# Patient Record
Sex: Male | Born: 1955 | Marital: Married | Smoking: Current some day smoker
Health system: Southern US, Community
[De-identification: ages and names within clinical notes are randomized; demographics above are authoritative.]

## PROBLEM LIST (undated history)

## (undated) DIAGNOSIS — B1910 Unspecified viral hepatitis B without hepatic coma: Secondary | ICD-10-CM

## (undated) DIAGNOSIS — D696 Thrombocytopenia, unspecified: Secondary | ICD-10-CM

## (undated) DIAGNOSIS — F17200 Nicotine dependence, unspecified, uncomplicated: Secondary | ICD-10-CM

---

## 2014-03-01 ENCOUNTER — Emergency Department (HOSPITAL_COMMUNITY)
Admission: EM | Admit: 2014-03-01 | Discharge: 2014-03-01 | Disposition: A | Discharge status: Home / Self Care | Payer: Self-pay | Attending: Emergency Medicine | Admitting: Emergency Medicine

## 2014-03-01 ENCOUNTER — Encounter (HOSPITAL_COMMUNITY): Payer: Self-pay | Admitting: *Deleted

## 2014-03-01 DIAGNOSIS — R509 Fever, unspecified: Secondary | ICD-10-CM | POA: Insufficient documentation

## 2014-03-01 DIAGNOSIS — B181 Chronic viral hepatitis B without delta-agent: Secondary | ICD-10-CM | POA: Insufficient documentation

## 2014-03-01 DIAGNOSIS — M545 Low back pain: Secondary | ICD-10-CM | POA: Insufficient documentation

## 2014-03-01 DIAGNOSIS — R197 Diarrhea, unspecified: Secondary | ICD-10-CM | POA: Insufficient documentation

## 2014-03-01 DIAGNOSIS — Z720 Tobacco use: Secondary | ICD-10-CM | POA: Insufficient documentation

## 2014-03-01 HISTORY — DX: Unspecified viral hepatitis B without hepatic coma: B19.10

## 2014-03-01 LAB — URINALYSIS, ROUTINE W REFLEX MICROSCOPIC
BILIRUBIN URINE: NEGATIVE
Glucose, UA: NEGATIVE mg/dL
Hgb urine dipstick: NEGATIVE
Ketones, ur: NEGATIVE mg/dL
Leukocytes, UA: NEGATIVE
Nitrite: NEGATIVE
PROTEIN: NEGATIVE mg/dL
Specific Gravity, Urine: <1.005 — ABNORMAL LOW (ref 1.005–1.030)
UROBILINOGEN UA: 1 mg/dL (ref 0.0–1.0)
pH: 7 (ref 5.0–8.0)

## 2014-03-01 LAB — COMPREHENSIVE METABOLIC PANEL
ALBUMIN: 3.5 g/dL (ref 3.5–5.2)
ALK PHOS: 56 U/L (ref 39–117)
ALT: 74 U/L — ABNORMAL HIGH (ref 0–53)
ANION GAP: 9 (ref 5–15)
AST: 45 U/L — ABNORMAL HIGH (ref 0–37)
BUN: 11 mg/dL (ref 6–23)
CO2: 26 mEq/L (ref 19–32)
CREATININE: 0.83 mg/dL (ref 0.50–1.35)
Calcium: 9.2 mg/dL (ref 8.4–10.5)
Chloride: 96 mEq/L (ref 96–112)
GFR calc Af Amer: >90 mL/min (ref >90)
GFR calc non Af Amer: >90 mL/min (ref >90)
Glucose, Bld: 103 mg/dL — ABNORMAL HIGH (ref 70–99)
POTASSIUM: 4.1 meq/L (ref 3.7–5.3)
Sodium: 131 mEq/L — ABNORMAL LOW (ref 137–147)
TOTAL PROTEIN: 7.3 g/dL (ref 6.0–8.3)
Total Bilirubin: 1.1 mg/dL (ref 0.3–1.2)

## 2014-03-01 LAB — CBC WITH DIFFERENTIAL/PLATELET
Basophils Absolute: 0 10*3/uL (ref 0.0–0.1)
Basophils Relative: 0 % (ref 0–1)
EOS ABS: 0.1 10*3/uL (ref 0.0–0.7)
Eosinophils Relative: 1 % (ref 0–5)
HCT: 40.3 % (ref 39.0–52.0)
HEMOGLOBIN: 14.4 g/dL (ref 13.0–17.0)
LYMPHS ABS: 1.4 10*3/uL (ref 0.7–4.0)
Lymphocytes Relative: 22 % (ref 12–46)
MCH: 30.4 pg (ref 26.0–34.0)
MCHC: 35.7 g/dL (ref 30.0–36.0)
MCV: 85.2 fL (ref 78.0–100.0)
MONOS PCT: 7 % (ref 3–12)
Monocytes Absolute: 0.5 10*3/uL (ref 0.1–1.0)
Neutro Abs: 4.4 10*3/uL (ref 1.7–7.7)
Neutrophils Relative %: 70 % (ref 43–77)
Platelets: 93 10*3/uL — ABNORMAL LOW (ref 150–400)
RBC: 4.73 MIL/uL (ref 4.22–5.81)
RDW: 13 % (ref 11.5–15.5)
WBC: 6.3 10*3/uL (ref 4.0–10.5)

## 2014-03-01 LAB — LIPASE, BLOOD: LIPASE: 28 U/L (ref 11–59)

## 2014-03-01 MED ORDER — ONDANSETRON HCL 4 MG PO TABS: 4.0000 mg | ORAL_TABLET | ORAL | Status: DC | PRN

## 2014-03-01 MED ORDER — LOPERAMIDE HCL 2 MG PO TABS: 2.0000 mg | ORAL_TABLET | Freq: Four times a day (QID) | ORAL | Status: DC | PRN

## 2014-03-01 NOTE — ED Notes (Signed)
Diarrhea, fever and lower back pain for x 3 days.

## 2014-03-01 NOTE — Discharge Instructions (Signed)
Diarrhea °Diarrhea is frequent loose and watery bowel movements. It can cause you to feel weak and dehydrated. Dehydration can cause you to become tired and thirsty, have a dry mouth, and have decreased urination that often is dark yellow. Diarrhea is a sign of another problem, most often an infection that will not last long. In most cases, diarrhea typically lasts 2-3 days. However, it can last longer if it is a sign of something more serious. It is important to treat your diarrhea as directed by your caregiver to lessen or prevent future episodes of diarrhea. °CAUSES  °Some common causes include: °· Gastrointestinal infections caused by viruses, bacteria, or parasites. °· Food poisoning or food allergies. °· Certain medicines, such as antibiotics, chemotherapy, and laxatives. °· Artificial sweeteners and fructose. °· Digestive disorders. °HOME CARE INSTRUCTIONS °· Ensure adequate fluid intake (hydration): Have 1 cup (8 oz) of fluid for each diarrhea episode. Avoid fluids that contain simple sugars or sports drinks, fruit juices, whole milk products, and sodas. Your urine should be clear or pale yellow if you are drinking enough fluids. Hydrate with an oral rehydration solution that you can purchase at pharmacies, retail stores, and online. You can prepare an oral rehydration solution at home by mixing the following ingredients together: °¨  - tsp table salt. °¨ ¾ tsp baking soda. °¨  tsp salt substitute containing potassium chloride. °¨ 1  tablespoons sugar. °¨ 1 L (34 oz) of water. °· Certain foods and beverages may increase the speed at which food moves through the gastrointestinal (GI) tract. These foods and beverages should be avoided and include: °¨ Caffeinated and alcoholic beverages. °¨ High-fiber foods, such as raw fruits and vegetables, nuts, seeds, and whole grain breads and cereals. °¨ Foods and beverages sweetened with sugar alcohols, such as xylitol, sorbitol, and mannitol. °· Some foods may be well  tolerated and may help thicken stool including: °¨ Starchy foods, such as rice, toast, pasta, low-sugar cereal, oatmeal, grits, baked potatoes, crackers, and bagels. °¨ Bananas. °¨ Applesauce. °· Add probiotic-rich foods to help increase healthy bacteria in the GI tract, such as yogurt and fermented milk products. °· Wash your hands well after each diarrhea episode. °· Only take over-the-counter or prescription medicines as directed by your caregiver. °· Take a warm bath to relieve any burning or pain from frequent diarrhea episodes. °SEEK IMMEDIATE MEDICAL CARE IF:  °· You are unable to keep fluids down. °· You have persistent vomiting. °· You have blood in your stool, or your stools are black and tarry. °· You do not urinate in 6-8 hours, or there is only a small amount of very dark urine. °· You have abdominal pain that increases or localizes. °· You have weakness, dizziness, confusion, or light-headedness. °· You have a severe headache. °· Your diarrhea gets worse or does not get better. °· You have a fever or persistent symptoms for more than 2-3 days. °· You have a fever and your symptoms suddenly get worse. °MAKE SURE YOU:  °· Understand these instructions. °· Will watch your condition. °· Will get help right away if you are not doing well or get worse. °Document Released: 03/22/2002 Document Revised: 08/16/2013 Document Reviewed: 12/08/2011 °ExitCare® Patient Information ©2015 [employer]. This information is not intended to replace advice given to you by your health care provider. Make sure you discuss any questions you have with your health care provider. ° ° °Emergency Department Resource Guide °1) Find a Doctor and Pay Out of Pocket °Although you   won't have to find out who is covered by your insurance plan, it is a good idea to ask around and get recommendations. You will then need to call the office and see if the doctor you have chosen will accept you as a new patient and what types of options they  offer for patients who are self-pay. Some doctors offer discounts or will set up payment plans for their patients who do not have insurance, but you will need to ask so you aren't surprised when you get to your appointment. ° °2) Contact Your Local Health Department °Not all health departments have doctors that can see patients for sick visits, but many do, so it is worth a call to see if yours does. If you don't know where your local health department is, you can check in your phone book. The CDC also has a tool to help you locate your state's health department, and many state websites also have listings of all of their local health departments. ° °3) Find a Walk-in Clinic °If your illness is not likely to be very severe or complicated, you may want to try a walk in clinic. These are popping up all over the country in pharmacies, drugstores, and shopping centers. They're usually staffed by nurse practitioners or physician assistants that have been trained to treat common illnesses and complaints. They're usually fairly quick and inexpensive. However, if you have serious medical issues or chronic medical problems, these are probably not your best option. ° °No Primary Care Doctor: °- Call [facility] at  832-8000 - they can help you locate a primary care doctor that  accepts your insurance, provides certain services, etc. °- Physician Referral Service- 1-800-533-3463 ° °Chronic Pain Problems: °Organization         Address  Phone   Notes  °[facility]  (336) 297-2271 Patients need to be referred by their primary care doctor.  ° °Medication Assistance: °Organization         Address  Phone   Notes  °[place] [facility] [address] °[place], [place] [address] (336) 641-8030 --Must be a resident of [place] °-- Must have NO insurance coverage whatsoever (no Medicaid/ Medicare, etc.) °-- The pt. MUST have a primary care doctor that directs their care  regularly and follows them in the community °  °[facility]  (866) 331-1348   °[employer]  (888) 892-1162   ° °Agencies that provide inexpensive medical care: °Organization         Address  Phone   Notes  °[facility]  (336) 832-8035   °[facility]    (336) 832-7272   °[facility] [address] °[place], [place] [address] (336) 832-4777   °[facility] [address], °[place] (336) 271-4999   °[facility]    (336) 373-0678   °[facility]    (336) 272-1050   °[facility] ° [address], [place] Phone:  (336) 832-4444, Fax:  (336) 832-4440 Hours of Operation:  9 am - 6 pm, M-F.  Also accepts Medicaid/Medicare and self-pay.  °[facility] ° [address],  Phone: (336) 832-3150, Fax: (336) 832-3151. Hours of Operation:  8:30 am - 5:30 pm, M-F.  Also accepts Medicaid and self-pay.  °[facility] [place] [address], [place] Phone: (336) 878-6027   °[facility] [address], [place], [place] (336)723-1848, Ext. 123 Mondays & Thursdays: 7-9 AM.  First 15 patients are seen on a first come, first serve basis. °  ° °Medicaid-accepting [place] Providers: ° °Organization         Address  Phone   Notes  °[facility] [address], [place] (336) 641-2100 Also accepts self-pay patients.  °[facility] [address], [place] ° (336) 856-9996   °[facility] [address], [place] (336) 288-8857   °[facility] [address] [place] [address], [place] (336) 299-7000   °Veita Bland [address] [place]  ° (336) 373-1557 Only accepts [place] Access Medicaid patients after they have their name applied to their card.  ° °Self-Pay (no insurance) in [place]: ° °Organization         Address  Phone    Notes  °Sickle Cell Patients, [facility] [address], [place] (336) 832-1970   °[facility] [address], [place] (336) 832-4400   °[facility] ° [address], [place] (336) 992-4800   °[facility]/Dr. Osei-Bonsu ° [address] [place] [address], [place] or [address], [place] (336) 841-8500 Phone number for both [place] and [place] locations is the same.  °[facility] [address], [place] (336) 299-0000   °[facility] [address] [place] [address], [place] or [address] (336) 852-7530 °(336) 878-2260   °[facility] [address], [place] (336) 350-1642, phone; (336) 294-5005, fax Sees patients 1st and 3rd Saturday of every month.  Must not qualify for public or private insurance (i.e. Medicaid, Medicare, [place] Health Choice, Veterans' Benefits) • Household income should be no more than 200% of the poverty level •The clinic cannot treat you if you are pregnant or think you are pregnant • Sexually transmitted diseases are not treated at the clinic.  ° ° °Dental Care: °Organization         Address  Phone  Notes  °[facility] [address], [place] (336) 641-6152 Accepts children up to age 21 who are enrolled in Medicaid or [place] Health Choice; pregnant women with a Medicaid card; and children who have applied for Medicaid or [place] Health Choice, but were declined, whose parents can pay a reduced fee at time of service.  °[facility] [place]  [address], [place] (336) 641-7733 Accepts children up to age 21 who are enrolled in Medicaid or [place] Health Choice; pregnant women with a Medicaid card; and children who have applied for Medicaid or  Health Choice, but were declined, whose parents can pay a reduced fee at time of service.  °[facility] ° [address],  (336) 641-4533 Patients are seen by appointment only. Walk-ins are not accepted. [facility] will see patients 18 years of age and older. °Monday - Tuesday (8am-5pm) °Most Wednesdays (8:30-5pm) °$30 per visit, cash only  °[facility] ° [address], [place] (336) 641-4533 Patients are seen by appointment only. Walk-ins are not accepted. [facility] will see patients 18 years of age and older. °One Wednesday Evening (Monthly: Volunteer Based).  $30 per visit, cash only  °[facility]  (919) 537-3737 for adults; Children under age 4, call Graduate Pediatric Dentistry at ([phone]) [phone]. Children aged 4-14, please call (919) [phone] to request a pediatric application. ° Dental services are provided in all areas of dental care including fillings, crowns and bridges, complete and partial dentures, implants, gum treatment, root canals, and extractions. Preventive care is also provided. Treatment is provided to both adults and children. °Patients are selected via a lottery and there is often a waiting list. °  °[facility] [address], °[place] ° (336) 763-8833 [email or web address] °  °[facility] [address], [place], [place] (336)723-1848, Ext. 123 Second and Fourth Thursday of each month, opens at 6:30 AM; Clinic ends at 9 AM.  Patients are seen on a first-come first-served basis, and a limited number are seen during each clinic.  ° °[facility] ° [address], [place], [place] (336) 723-7904   Eligibility Requirements °You must have lived in [place], [place], or [place] counties for at least the last three months. °  You cannot be eligible for state or federal sponsored healthcare insurance, including Veterans Administration, Medicaid, or Medicare. °  You generally cannot be eligible for healthcare insurance through your employer.  °  How to  apply: °Eligibility screenings are held every Tuesday and Wednesday afternoon from 1:00 pm until 4:00 pm. You do not need an appointment for the interview!  °[facility] [address], [place], [place] 336-631-2330   °[facility]  336-342-8273   °[facility]  336-703-3100   °[facility]  336-570-6415   ° °Behavioral Health Resources in the Community: °Intensive Outpatient Programs °Organization         Address  Phone  Notes  °[facility] [address], [place], [place] 336-878-6098   °[facility] [address], [place], [place] 336-832-9800   °[facility] [address], [place], [place] ° 336-882-2125   °[facility] [address],  °[place], [place] 1-800-853-5163 or 336-641-4981   °Substance Abuse Resources °Organization         Address  Phone  Notes  °[facility]  336-882-2125   °[facility]  336-784-9470   °[facility]  336-285-9073   °[facility]  336-845-3988   °Residential & Outpatient Substance Abuse Program  1-800-659-3381   °Psychological Services °Organization         Address  Phone  Notes  °[facility]  336- 832-9600   °[facility]  336- 378-7881   °[facility] [address], [place] 1-800-853-5163 or 336-641-4981   ° °Mobile Crisis Teams °Organization         Address  Phone  Notes  °[facility], Mobile Crisis Care Unit  1-877-626-1772   °Assertive °[facility] ° [address] [place], [place] 336-834-9664   °Sharon DeEsch [address] °[place] 336-554-5454   ° °Self-Help/Support Groups °Organization         Address  Phone             Notes  °[facility] - variety of support groups  336- 373-1402 Call for more information  °Narcotics Anonymous (NA), [facility] [address], °[place]   2 meetings at this location  ° °Residential Treatment Programs °Organization           Address  Phone  Notes  °[facility] [address],    °[place]  1-866-801-8205   °[facility] ° [address], [place], [place] 704-293-8524   °[facility] [address], [place] 336-845-3988 Admissions: 8am-3pm M-F  °[facility] [address]    °[place], [place] 336-841-1104   °[facility] [address], [place], [place] 336-379-7146   °[facility] [address]  °[place], [place] 336-285-9073   °[facility] Intensive Outpatient [address], [place], [place] 336-852-3033   °[facility] [address]  °[place], [place] 1-877-615-2722 or 336-784-9470   °[facility] [address] [place], [place] 336-227-7417 Accepts Medicaid  °[facility] [address]  °[place] 1-800-659-3381 Substance Abuse/Addiction Treatment  ° °[place] Behavioral Health Resources °Organization         Address  Phone  Notes  °[facility]  (888) 581-9988   °Julie Brannon, PhD [address] [place], [place]   (336) 349-5553 or (336) 951-0000   °[facility]   [address] °[place], [place] (336) 349-4454   °[facility] [address], [place], [place] (336) 342-8316 Insurance/Medicaid/sponsorship through [facility]  °[facility] [address]                                    [place], [place] (336) 342-8316 Therapy/tele-psych/case  °[facility] [address]  ° [place], [place] (336) 349-2233    °Dr. Arfeen  (336) 349-4544   °[facility] [place]  [employer] [place] [facility] 1) [address], [place] °2) [address], [place] °3)  [address], [place] (336) 349-3220 °(336) 342-7768 ° °(336) 342-8140   °[place] [facility]  (336) 342-1394 or (336) 342-3537 (After Hours)    ° °  °

## 2014-03-01 NOTE — ED Provider Notes (Signed)
CSN: [id number]     Arrival date & time 03/01/14  1301 History   First MD Initiated Contact with Patient 03/01/14 1832     Chief Complaint  Patient presents with  . Diarrhea  . Fever  . Back Pain     (Consider location/radiation/quality/duration/timing/severity/associated sxs/prior Treatment) HPI The patient has known medical history of hepatitis B. Otherwise he is reportedly healthy. 3 days ago the patient started having a diarrheal illness with associated chills and subjective fever. He is having about 3-4 episodes of diarrhea per day. It is not bloody. Patient gets generalized cramping abdominal pain. He did develop vomiting last night. He reports approximately 3-4 episodes of vomiting with no blood in it.  He reports also some achiness in his lower back. The patient has lived in this area for 2 years. He denies he's had any travel history. He denies any sick contacts. 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 He has not been on any antibiotics and does not take any regular medications.  The patient has a family member present who is acting as the interpreter.the patient is interacting appropriately with the family member and appears alert and appropriate. He follows commands as given. Past Medical History  Diagnosis Date  . Hepatitis B    History reviewed. No pertinent past surgical history. History reviewed. No pertinent family history. History  Substance Use Topics  . Smoking status: Current Every Day Smoker  . Smokeless tobacco: Not on file  . Alcohol Use: No    Review of Systems 10 Systems reviewed and are negative for acute change except as noted in the HPI.    Allergies  Review of patient's allergies indicates no known allergies.  Home Medications   Prior to Admission medications   Medication Sig Start Date End Date Taking? Authorizing Provider  loperamide (IMODIUM A-D) 2 MG tablet Take 1 tablet (2 mg total) by mouth 4 (four) times daily as needed for  diarrhea or loose stools. 03/01/14   Arby BarretteMarcy Royalty Fakhouri, MD  ondansetron (ZOFRAN) 4 MG tablet Take 1 tablet (4 mg total) by mouth every 4 (four) hours as needed for nausea or vomiting. 03/01/14   Arby BarretteMarcy Aaren Atallah, MD   BP 133/77 mmHg  Pulse 58  Temp(Src) 98.5 F (36.9 C) (Oral)  Resp 20  Ht 5\' 4"  (1.626 m)  Wt 135 lb 9 oz (61.491 kg)  BMI 23.26 kg/m2  SpO2 100% Physical Exam  Constitutional: He is oriented to person, place, and time. He appears well-developed and well-nourished.  HENT:  Head: Normocephalic and atraumatic.  Eyes: EOM are normal. Pupils are equal, round, and reactive to light.  Neck: Neck supple.  Cardiovascular: Normal rate, regular rhythm, normal heart sounds and intact distal pulses.   Pulmonary/Chest: Effort normal and breath sounds normal.  Abdominal: Soft. He exhibits no distension. There is tenderness (The patient endorses mild tenderness to palpation diffusely.the abdomen however is completely soft without guarding.).  Bowel sounds are hyperactive.  Musculoskeletal: Normal range of motion. He exhibits no edema.  Neurological: He is alert and oriented to person, place, and time. He has normal strength. Coordination normal. GCS eye subscore is 4. GCS verbal subscore is 5. GCS motor subscore is 6.  Skin: Skin is warm, dry and intact.  Psychiatric: He has a normal mood and affect.    ED Course  Procedures (including critical care time) Labs Review Labs Reviewed  CBC WITH DIFFERENTIAL - Abnormal; Notable for the following:    Platelets 93 (*)  All other components within normal limits  COMPREHENSIVE METABOLIC PANEL - Abnormal; Notable for the following:    Sodium 131 (*)    Glucose, Bld 103 (*)    AST 45 (*)    ALT 74 (*)    All other components within normal limits  URINALYSIS, ROUTINE W REFLEX MICROSCOPIC - Abnormal; Notable for the following:    Specific Gravity, Urine <1.005 (*)    All other components within normal limits  LIPASE, BLOOD    Imaging  Review No results found.   EKG Interpretation None      MDM   Final diagnoses:  Acute diarrhea  Chronic hepatitis B   At this point in time the patient presents with a diarrheal illness. He has also had some associated vomiting. He is nontoxic in appearance. He has known hepatitis B with some mild LFT elevations. I do not believe he is having any acute hepatitis related symptoms. By history he does not have risk factors of travel or antibiotic use or healthcare exposure.. At this time it appears most likely to be a viral gastroenteritis. The patient will be treated symptomatically.    Arby BarretteMarcy Ezra Marquess, MD 03/01/14 [phone]

## 2014-03-21 ENCOUNTER — Emergency Department (HOSPITAL_COMMUNITY): Payer: MEDICAID

## 2014-03-21 ENCOUNTER — Encounter (HOSPITAL_COMMUNITY): Payer: Self-pay | Admitting: Emergency Medicine

## 2014-03-21 ENCOUNTER — Inpatient Hospital Stay (HOSPITAL_COMMUNITY)
Admission: EM | Admit: 2014-03-21 | Discharge: 2014-03-28 | DRG: 326 | Disposition: A | Discharge status: Home / Self Care | Payer: MEDICAID | Attending: General Surgery | Admitting: General Surgery

## 2014-03-21 DIAGNOSIS — K659 Peritonitis, unspecified: Secondary | ICD-10-CM | POA: Diagnosis present

## 2014-03-21 DIAGNOSIS — K631 Perforation of intestine (nontraumatic): Secondary | ICD-10-CM

## 2014-03-21 DIAGNOSIS — K265 Chronic or unspecified duodenal ulcer with perforation: Principal | ICD-10-CM | POA: Diagnosis present

## 2014-03-21 DIAGNOSIS — F17210 Nicotine dependence, cigarettes, uncomplicated: Secondary | ICD-10-CM | POA: Diagnosis present

## 2014-03-21 DIAGNOSIS — B1910 Unspecified viral hepatitis B without hepatic coma: Secondary | ICD-10-CM | POA: Diagnosis present

## 2014-03-21 DIAGNOSIS — D696 Thrombocytopenia, unspecified: Secondary | ICD-10-CM | POA: Diagnosis present

## 2014-03-21 DIAGNOSIS — F17200 Nicotine dependence, unspecified, uncomplicated: Secondary | ICD-10-CM | POA: Diagnosis present

## 2014-03-21 HISTORY — DX: Thrombocytopenia, unspecified: D69.6

## 2014-03-21 HISTORY — DX: Nicotine dependence, unspecified, uncomplicated: F17.200

## 2014-03-21 LAB — COMPREHENSIVE METABOLIC PANEL
ALT: 137 U/L — ABNORMAL HIGH (ref 0–53)
ANION GAP: 12 (ref 5–15)
AST: 80 U/L — ABNORMAL HIGH (ref 0–37)
Albumin: 3.7 g/dL (ref 3.5–5.2)
Alkaline Phosphatase: 64 U/L (ref 39–117)
BUN: 10 mg/dL (ref 6–23)
CALCIUM: 9.1 mg/dL (ref 8.4–10.5)
CO2: 24 meq/L (ref 19–32)
CREATININE: 0.8 mg/dL (ref 0.50–1.35)
Chloride: 98 mEq/L (ref 96–112)
GFR calc Af Amer: >90 mL/min (ref >90)
Glucose, Bld: 126 mg/dL — ABNORMAL HIGH (ref 70–99)
Potassium: 3.4 mEq/L — ABNORMAL LOW (ref 3.7–5.3)
Sodium: 134 mEq/L — ABNORMAL LOW (ref 137–147)
Total Bilirubin: 0.9 mg/dL (ref 0.3–1.2)
Total Protein: 7.4 g/dL (ref 6.0–8.3)

## 2014-03-21 LAB — CBC WITH DIFFERENTIAL/PLATELET
Basophils Absolute: 0 10*3/uL (ref 0.0–0.1)
Basophils Relative: 0 % (ref 0–1)
EOS PCT: 1 % (ref 0–5)
Eosinophils Absolute: 0 10*3/uL (ref 0.0–0.7)
HCT: 41.8 % (ref 39.0–52.0)
Hemoglobin: 14.6 g/dL (ref 13.0–17.0)
LYMPHS PCT: 13 % (ref 12–46)
Lymphs Abs: 0.4 10*3/uL — ABNORMAL LOW (ref 0.7–4.0)
MCH: 31 pg (ref 26.0–34.0)
MCHC: 34.9 g/dL (ref 30.0–36.0)
MCV: 88.7 fL (ref 78.0–100.0)
MONO ABS: 0.2 10*3/uL (ref 0.1–1.0)
Monocytes Relative: 6 % (ref 3–12)
NEUTROS PCT: 80 % — AB (ref 43–77)
Neutro Abs: 2.8 10*3/uL (ref 1.7–7.7)
PLATELETS: 84 10*3/uL — AB (ref 150–400)
RBC: 4.71 MIL/uL (ref 4.22–5.81)
RDW: 13.9 % (ref 11.5–15.5)
WBC: 3.4 10*3/uL — AB (ref 4.0–10.5)

## 2014-03-21 MED ORDER — PIPERACILLIN-TAZOBACTAM 3.375 G IVPB
3.3750 g | Freq: Once | INTRAVENOUS | Status: AC
  Administered 2014-03-21: 3.375 g via INTRAVENOUS
  Filled 2014-03-21: qty 50

## 2014-03-21 MED ORDER — OXYCODONE-ACETAMINOPHEN 5-325 MG PO TABS
2.0000 | ORAL_TABLET | Freq: Once | ORAL | Status: AC
  Administered 2014-03-21: 2 via ORAL
  Filled 2014-03-21: qty 2

## 2014-03-21 MED ORDER — IOHEXOL 300 MG/ML  SOLN
50.0000 mL | Freq: Once | INTRAMUSCULAR | Status: AC | PRN
  Administered 2014-03-21: 50 mL via ORAL

## 2014-03-21 MED ORDER — SODIUM CHLORIDE 0.9 % IV BOLUS (SEPSIS)
1000.0000 mL | Freq: Once | INTRAVENOUS | Status: AC
  Administered 2014-03-21: 1000 mL via INTRAVENOUS

## 2014-03-21 MED ORDER — HYDROMORPHONE HCL 1 MG/ML IJ SOLN: 0.5000 mg | Freq: Once | INTRAMUSCULAR | Status: DC

## 2014-03-21 MED ORDER — IOHEXOL 300 MG/ML  SOLN
100.0000 mL | Freq: Once | INTRAMUSCULAR | Status: AC | PRN
  Administered 2014-03-21: 100 mL via INTRAVENOUS

## 2014-03-21 NOTE — ED Notes (Addendum)
Pt states he started having lower abdominal/pelvic pain approx. 2 hours ago after eating dinner. Alert and oriented. Speaks karen.

## 2014-03-21 NOTE — ED Notes (Signed)
Bed: ZO10WA05 Expected date:  Expected time:  Means of arrival:  Comments: EMS 58 yo male from home/lower abdominal and groin pain after dinner

## 2014-03-21 NOTE — ED Provider Notes (Signed)
CSN: [id number]     Arrival date & time 03/21/14  2022 History   First MD Initiated Contact with Patient 03/21/14 2023     Chief Complaint  Patient presents with  . Groin Pain   Yichen Moder is a 58 y.o. male with a history of hepatitis B who presents to the ED complaining of sudden onset bilateral groin pain for the past 2 hours. Patient reports his pain as 5 out of 10 and worse with walking. Patient reports his pain was sudden in onset and started about 2 hours ago. He denies testicular pain.  The patient denies dysuria, hematuria, penile pain, penile discharge, rashes, lesions, abdominal pain, nausea, vomiting, diarrhea, hematochezia, or constipation. He denies any unusual lumps or bumps.   (Consider location/radiation/quality/duration/timing/severity/associated sxs/prior Treatment) Patient is a 58 y.o. male presenting with groin pain. The history is provided by the patient. The history is limited by a language barrier. A language interpreter was used.  Groin Pain Associated symptoms include abdominal pain. Pertinent negatives include no chest pain, chills, coughing, fever, headaches, nausea, neck pain, rash, vomiting or weakness.    Past Medical History  Diagnosis Date  . Hepatitis B    History reviewed. No pertinent past surgical history. History reviewed. No pertinent family history. History  Substance Use Topics  . Smoking status: Current Every Day Smoker  . Smokeless tobacco: Not on file  . Alcohol Use: No    Review of Systems  Constitutional: Negative for fever and chills.  HENT: Negative for ear pain and trouble swallowing.   Eyes: Negative for pain.  Respiratory: Negative for cough and shortness of breath.   Cardiovascular: Negative for chest pain and palpitations.  Gastrointestinal: Positive for abdominal pain. Negative for nausea, vomiting, diarrhea, constipation, blood in stool and abdominal distention.  Genitourinary: Negative for dysuria, urgency, frequency,  hematuria, penile swelling, scrotal swelling, penile pain and testicular pain.  Musculoskeletal: Negative for neck pain.  Skin: Negative for rash and wound.  Neurological: Negative for weakness and headaches.  All other systems reviewed and are negative.     Allergies  Review of patient's allergies indicates no known allergies.  Home Medications   Prior to Admission medications   Medication Sig Start Date End Date Taking? Authorizing Provider  loperamide (IMODIUM A-D) 2 MG tablet Take 1 tablet (2 mg total) by mouth 4 (four) times daily as needed for diarrhea or loose stools. Patient not taking: Reported on 03/21/2014 03/01/14   Arby Barrette, MD  ondansetron (ZOFRAN) 4 MG tablet Take 1 tablet (4 mg total) by mouth every 4 (four) hours as needed for nausea or vomiting. Patient not taking: Reported on 03/21/2014 03/01/14   Arby Barrette, MD   BP 145/86 mmHg  Pulse 72  Temp(Src) 98.1 F (36.7 C) (Oral)  Resp 26  SpO2 100% Physical Exam  Constitutional: He appears well-developed and well-nourished. No distress.  HENT:  Head: Normocephalic and atraumatic.  Mouth/Throat: Oropharynx is clear and moist. No oropharyngeal exudate.  Eyes: Pupils are equal, round, and reactive to light. Right eye exhibits no discharge. Left eye exhibits no discharge.  Neck: Neck supple.  Cardiovascular: Normal rate, regular rhythm, normal heart sounds and intact distal pulses.  Exam reveals no gallop and no friction rub.   No murmur heard. Pulmonary/Chest: Effort normal. No respiratory distress. He has no wheezes. He has no rales.  Abdominal: Soft. Bowel sounds are normal. He exhibits no distension and no mass. There is tenderness. There is no rebound and no guarding. Hernia  confirmed negative in the right inguinal area and confirmed negative in the left inguinal area.  Patient's abdomen is soft and bowel sounds are present. Patient has bilateral lower abdominal pain to palpation. No rebound tenderness.   Genitourinary: Testes normal and penis normal. Cremasteric reflex is present. Right testis shows no mass, no swelling and no tenderness. Left testis shows no mass, no swelling and no tenderness. No penile erythema or penile tenderness. No discharge found.  Patient's GU exam is unremarkable. No penile tenderness or discharge. No testicular tenderness. No abrasions or lesions noted. No hernias noted. Cremasteric reflexes intact.  Musculoskeletal: He exhibits no edema.  Lymphadenopathy:    He has no cervical adenopathy.  Neurological: He is alert. Coordination normal.  Skin: Skin is warm and dry. No rash noted. He is not diaphoretic. No erythema. No pallor.  Psychiatric: He has a normal mood and affect. His behavior is normal.  Nursing note and vitals reviewed.   ED Course  Procedures (including critical care time) Labs Review Labs Reviewed  COMPREHENSIVE METABOLIC PANEL - Abnormal; Notable for the following:    Sodium 134 (*)    Potassium 3.4 (*)    Glucose, Bld 126 (*)    AST 80 (*)    ALT 137 (*)    All other components within normal limits  CBC WITH DIFFERENTIAL - Abnormal; Notable for the following:    WBC 3.4 (*)    Platelets 84 (*)    Neutrophils Relative % 80 (*)    Lymphs Abs 0.4 (*)    All other components within normal limits  URINALYSIS, ROUTINE W REFLEX MICROSCOPIC  I-STAT CG4 LACTIC ACID, ED  TYPE AND SCREEN  ABO/RH    Imaging Review Ct Abdomen Pelvis W Contrast  03/21/2014   CLINICAL DATA:  RIGHT lower abdominal and pelvic pain 2 hours after eating dinner this evening. History of hepatitis-B.  EXAM: CT ABDOMEN AND PELVIS WITH CONTRAST  TECHNIQUE: Multidetector CT imaging of the abdomen and pelvis was performed using the standard protocol following bolus administration of intravenous contrast.  CONTRAST:  100mL OMNIPAQUE IOHEXOL 300 MG/ML SOLN, 50mL OMNIPAQUE IOHEXOL 300 MG/ML SOLN  COMPARISON:  None.  FINDINGS: LUNG BASES: Dependent atelectasis without pleural  effusion or focal consolidation.  SOLID ORGANS: The liver, spleen, pancreas and adrenal glands are unremarkable. 21 mm gallstone without CT findings of acute cholecystitis.  GASTROINTESTINAL TRACT: The proximal duodenum are approaching extending through the apparent serosa, axial 28/93. Stomach is decompressed. Small amount of small bowel feces. Enteric contrast has not yet reached the distal small bowel. Large bowel is nonsuspicious.  KIDNEYS/ URINARY TRACT: Kidneys are orthotopic, demonstrating symmetric enhancement. No nephrolithiasis, hydronephrosis or solid renal masses. 13 mm cyst LEFT upper pole kidney. The unopacified ureters are normal in course and caliber. Delayed imaging through the kidneys demonstrates symmetric prompt contrast excretion within the proximal urinary collecting system. Urinary bladder is well distended and unremarkable.  PERITONEUM/RETROPERITONEUM: Small to moderate amount of ascites. Moderate pneumoperitoneum within the upper quadrant, predominately in the RIGHT upper quadrant, contiguous with the duodenum bowel patching. Aortoiliac vessels are normal in course and caliber, mild calcific atherosclerosis. No lymphadenopathy by CT size criteria. Internal reproductive organs are unremarkable.  SOFT TISSUE/OSSEOUS STRUCTURES: Nonsuspicious. Subcentimeter RIGHT acetabular bone island. 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  IMPRESSION: Moderate pneumoperitoneum and mild to moderate amount of ascites, this appears to reflect sequelae of perforated duodenal ulcer.  Cholelithiasis without CT findings of acute cholecystitis.  Acute findings discussed with and  reconfirmed by PA Everlene FarrierWILLIAM Yehya Brendle on 03/21/2014 at 11:13 pm.   Electronically Signed   By: Awilda Metroourtnay  Bloomer   On: 03/21/2014 23:14     EKG Interpretation None      Filed Vitals:   03/21/14 2022 03/21/14 2302  BP: 140/81 145/86  Pulse: 68 72  Temp: 98.1 F (36.7 C)   TempSrc: Oral   Resp: 16 26   SpO2: 100% 100%     MDM   Meds given in ED:  Medications  piperacillin-tazobactam (ZOSYN) IVPB 3.375 g (3.375 g Intravenous New Bag/Given 03/21/14 2342)  HYDROmorphone (DILAUDID) injection 0.5 mg ( Intravenous MAR Hold 03/22/14 0101)  0.9 % irrigation (POUR BTL) (4,000 mLs Irrigation Given 03/22/14 0158)  oxyCODONE-acetaminophen (PERCOCET/ROXICET) 5-325 MG per tablet 2 tablet (2 tablets Oral Given 03/21/14 2140)  iohexol (OMNIPAQUE) 300 MG/ML solution 50 mL (50 mLs Oral Contrast Given 03/21/14 2249)  iohexol (OMNIPAQUE) 300 MG/ML solution 100 mL (100 mLs Intravenous Contrast Given 03/21/14 2249)  sodium chloride 0.9 % bolus 1,000 mL (1,000 mLs Intravenous New Bag/Given 03/21/14 2340)    Current Discharge Medication List      Final diagnoses:  Bowel perforation   Lynelle Doctorhaing Sami is a 58 y.o. male with a history of hepatitis B who presents to the ED complaining of sudden onset bilateral groin pain for the past 2 hours. Patient's lower abdomen is tender to palpation. On patient's CT scan patient had bowel perforation, radiology suspects possible perforated duodenal ulcer. Dr. Patria Maneampos consulted general surgery. Patient is provided Dilaudid and Zosyn prior to admission.  [facility]   Patient was discussed with and evaluated by Dr. Patria Maneampos who agrees with assessment and plan.     Lawana ChambersWilliam Duncan Tanay Misuraca, PA-C 03/22/14 [id number]  Lyanne CoKevin M Campos, MD 03/22/14 [phone]

## 2014-03-21 NOTE — ED Notes (Signed)
Pt placed on cardiac monitor 

## 2014-03-21 NOTE — ED Notes (Signed)
Phone interpreter used to assess pt

## 2014-03-22 ENCOUNTER — Encounter (HOSPITAL_COMMUNITY): Payer: Self-pay | Admitting: Certified Registered"

## 2014-03-22 ENCOUNTER — Emergency Department (HOSPITAL_COMMUNITY): Payer: MEDICAID | Admitting: Anesthesiology

## 2014-03-22 ENCOUNTER — Encounter (HOSPITAL_COMMUNITY): Admission: EM | Disposition: A | Discharge status: Home / Self Care | Payer: Self-pay | Source: Home / Self Care

## 2014-03-22 DIAGNOSIS — K265 Chronic or unspecified duodenal ulcer with perforation: Secondary | ICD-10-CM | POA: Diagnosis present

## 2014-03-22 HISTORY — PX: LAPAROTOMY: SHX154

## 2014-03-22 LAB — CBC
HCT: 40.4 % (ref 39.0–52.0)
HEMOGLOBIN: 14 g/dL (ref 13.0–17.0)
MCH: 30.8 pg (ref 26.0–34.0)
MCHC: 34.7 g/dL (ref 30.0–36.0)
MCV: 88.8 fL (ref 78.0–100.0)
PLATELETS: 78 10*3/uL — AB (ref 150–400)
RBC: 4.55 MIL/uL (ref 4.22–5.81)
RDW: 14.2 % (ref 11.5–15.5)
WBC: 7.4 10*3/uL (ref 4.0–10.5)

## 2014-03-22 LAB — URINALYSIS, ROUTINE W REFLEX MICROSCOPIC
Bilirubin Urine: NEGATIVE
GLUCOSE, UA: NEGATIVE mg/dL
HGB URINE DIPSTICK: NEGATIVE
KETONES UR: NEGATIVE mg/dL
Leukocytes, UA: NEGATIVE
Nitrite: NEGATIVE
Protein, ur: NEGATIVE mg/dL
Specific Gravity, Urine: 1.026 (ref 1.005–1.030)
UROBILINOGEN UA: 0.2 mg/dL (ref 0.0–1.0)
pH: 5.5 (ref 5.0–8.0)

## 2014-03-22 LAB — TYPE AND SCREEN
ABO/RH(D): B POS
Antibody Screen: NEGATIVE

## 2014-03-22 LAB — BASIC METABOLIC PANEL
ANION GAP: 12 (ref 5–15)
BUN: 9 mg/dL (ref 6–23)
CHLORIDE: 101 meq/L (ref 96–112)
CO2: 23 mEq/L (ref 19–32)
CREATININE: 0.8 mg/dL (ref 0.50–1.35)
Calcium: 8.8 mg/dL (ref 8.4–10.5)
GFR calc non Af Amer: >90 mL/min (ref >90)
Glucose, Bld: 125 mg/dL — ABNORMAL HIGH (ref 70–99)
Potassium: 4.1 mEq/L (ref 3.7–5.3)
Sodium: 136 mEq/L — ABNORMAL LOW (ref 137–147)

## 2014-03-22 LAB — CG4 I-STAT (LACTIC ACID): LACTIC ACID, VENOUS: 1.11 mmol/L (ref 0.5–2.2)

## 2014-03-22 LAB — ABO/RH: ABO/RH(D): B POS

## 2014-03-22 SURGERY — LAPAROTOMY, EXPLORATORY
Anesthesia: General | Site: Abdomen

## 2014-03-22 MED ORDER — PIPERACILLIN-TAZOBACTAM 3.375 G IVPB
3.3750 g | Freq: Three times a day (TID) | INTRAVENOUS | Status: AC
  Administered 2014-03-22 – 2014-03-28 (×19): 3.375 g via INTRAVENOUS
  Filled 2014-03-22 (×20): qty 50

## 2014-03-22 MED ORDER — ONDANSETRON HCL 4 MG/2ML IJ SOLN
INTRAMUSCULAR | Status: DC | PRN
  Administered 2014-03-22: 4 mg via INTRAVENOUS

## 2014-03-22 MED ORDER — LACTATED RINGERS IV SOLN: INTRAVENOUS | Status: DC

## 2014-03-22 MED ORDER — MENTHOL 3 MG MT LOZG: 1.0000 | LOZENGE | OROMUCOSAL | Status: DC | PRN

## 2014-03-22 MED ORDER — PANTOPRAZOLE SODIUM 40 MG IV SOLR
40.0000 mg | Freq: Two times a day (BID) | INTRAVENOUS | Status: DC
  Administered 2014-03-22 – 2014-03-27 (×12): 40 mg via INTRAVENOUS
  Filled 2014-03-22 (×14): qty 40

## 2014-03-22 MED ORDER — SUCCINYLCHOLINE CHLORIDE 20 MG/ML IJ SOLN
INTRAMUSCULAR | Status: DC | PRN
  Administered 2014-03-22: 100 mg via INTRAVENOUS

## 2014-03-22 MED ORDER — ONDANSETRON HCL 4 MG PO TABS: 4.0000 mg | ORAL_TABLET | Freq: Four times a day (QID) | ORAL | Status: DC | PRN

## 2014-03-22 MED ORDER — LACTATED RINGERS IV SOLN
INTRAVENOUS | Status: DC | PRN
  Administered 2014-03-22: 01:00:00 via INTRAVENOUS

## 2014-03-22 MED ORDER — NEOSTIGMINE METHYLSULFATE 10 MG/10ML IV SOLN
INTRAVENOUS | Status: DC | PRN
Start: 2014-03-22 — End: 2014-03-22
  Administered 2014-03-22: 3 mg via INTRAVENOUS

## 2014-03-22 MED ORDER — MIDAZOLAM HCL 2 MG/2ML IJ SOLN
INTRAMUSCULAR | Status: AC
  Filled 2014-03-22: qty 2

## 2014-03-22 MED ORDER — ONDANSETRON HCL 4 MG/2ML IJ SOLN: 4.0000 mg | Freq: Once | INTRAMUSCULAR | Status: DC | PRN

## 2014-03-22 MED ORDER — ENOXAPARIN SODIUM 40 MG/0.4ML ~~LOC~~ SOLN
40.0000 mg | SUBCUTANEOUS | Status: DC
  Administered 2014-03-23 – 2014-03-27 (×5): 40 mg via SUBCUTANEOUS
  Filled 2014-03-22 (×6): qty 0.4

## 2014-03-22 MED ORDER — ROCURONIUM BROMIDE 100 MG/10ML IV SOLN
INTRAVENOUS | Status: AC
  Filled 2014-03-22: qty 1

## 2014-03-22 MED ORDER — GLYCOPYRROLATE 0.2 MG/ML IJ SOLN
INTRAMUSCULAR | Status: AC
  Filled 2014-03-22: qty 2

## 2014-03-22 MED ORDER — ROCURONIUM BROMIDE 100 MG/10ML IV SOLN
INTRAVENOUS | Status: DC | PRN
  Administered 2014-03-22 (×2): 10 mg via INTRAVENOUS
  Administered 2014-03-22: 30 mg via INTRAVENOUS

## 2014-03-22 MED ORDER — 0.9 % SODIUM CHLORIDE (POUR BTL) OPTIME
TOPICAL | Status: DC | PRN
  Administered 2014-03-22: 4000 mL

## 2014-03-22 MED ORDER — LIDOCAINE HCL (PF) 2 % IJ SOLN
INTRAMUSCULAR | Status: DC | PRN
  Administered 2014-03-22: 20 mg via INTRADERMAL

## 2014-03-22 MED ORDER — INFLUENZA VAC SPLIT QUAD 0.5 ML IM SUSY
0.5000 mL | PREFILLED_SYRINGE | Freq: Once | INTRAMUSCULAR | Status: DC
  Filled 2014-03-22: qty 0.5

## 2014-03-22 MED ORDER — MORPHINE SULFATE (PF) 1 MG/ML IV SOLN
INTRAVENOUS | Status: DC
  Administered 2014-03-22: 16.91 mg via INTRAVENOUS
  Administered 2014-03-22: 9 mg via INTRAVENOUS
  Administered 2014-03-22: 15:00:00 via INTRAVENOUS
  Administered 2014-03-22: 16.5 mg via INTRAVENOUS
  Administered 2014-03-22: 04:00:00 via INTRAVENOUS
  Administered 2014-03-22: 1.5 mg via INTRAVENOUS
  Administered 2014-03-22: 22.09 mg via INTRAVENOUS
  Administered 2014-03-23: 13.5 mg via INTRAVENOUS
  Administered 2014-03-23: 1.7 mg via INTRAVENOUS
  Administered 2014-03-23: 10.5 mg via INTRAVENOUS
  Administered 2014-03-23: 02:00:00 via INTRAVENOUS
  Administered 2014-03-23: 9 mg via INTRAVENOUS
  Administered 2014-03-23: 19.5 mg via INTRAVENOUS
  Administered 2014-03-23: 19 mg via INTRAVENOUS
  Administered 2014-03-24: 24.3 mg via INTRAVENOUS
  Administered 2014-03-24: 09:00:00 via INTRAVENOUS
  Administered 2014-03-24: 2.59 mg via INTRAVENOUS
  Administered 2014-03-24: 12 mg via INTRAVENOUS
  Administered 2014-03-24: 13.5 mg via INTRAVENOUS
  Administered 2014-03-24: 7.5 mg via INTRAVENOUS
  Administered 2014-03-24: 15 mg via INTRAVENOUS
  Administered 2014-03-25: 10.5 mg via INTRAVENOUS
  Administered 2014-03-25: 1.5 mg via INTRAVENOUS
  Administered 2014-03-25: 33 mg via INTRAVENOUS
  Administered 2014-03-25: 4.5 mg via INTRAVENOUS
  Administered 2014-03-25 (×3): via INTRAVENOUS
  Administered 2014-03-26: 15.78 mg via INTRAVENOUS
  Administered 2014-03-26: 6 mg via INTRAVENOUS
  Administered 2014-03-26: 3.5 mg via INTRAVENOUS
  Administered 2014-03-26: 33 mg via INTRAVENOUS
  Administered 2014-03-26: 12:00:00 via INTRAVENOUS
  Administered 2014-03-26: 10.5 mg via INTRAVENOUS
  Administered 2014-03-27: 12 mg via INTRAVENOUS
  Filled 2014-03-22 (×16): qty 25

## 2014-03-22 MED ORDER — KCL-LACTATED RINGERS-D5W 20 MEQ/L IV SOLN
INTRAVENOUS | Status: DC
  Administered 2014-03-22: 06:00:00 via INTRAVENOUS
  Administered 2014-03-23 (×2): 1000 mL via INTRAVENOUS
  Administered 2014-03-24 – 2014-03-25 (×2): via INTRAVENOUS
  Administered 2014-03-25: 125 mL/h via INTRAVENOUS
  Administered 2014-03-26: 12:00:00 via INTRAVENOUS
  Administered 2014-03-26: 125 mL/h via INTRAVENOUS
  Administered 2014-03-27 – 2014-03-28 (×4): via INTRAVENOUS
  Filled 2014-03-22 (×21): qty 1000

## 2014-03-22 MED ORDER — SODIUM CHLORIDE 0.9 % IJ SOLN: 9.0000 mL | INTRAMUSCULAR | Status: DC | PRN

## 2014-03-22 MED ORDER — NALOXONE HCL 0.4 MG/ML IJ SOLN: 0.4000 mg | INTRAMUSCULAR | Status: DC | PRN

## 2014-03-22 MED ORDER — ONDANSETRON HCL 4 MG/2ML IJ SOLN: 4.0000 mg | Freq: Four times a day (QID) | INTRAMUSCULAR | Status: DC | PRN

## 2014-03-22 MED ORDER — HYDROMORPHONE HCL 1 MG/ML IJ SOLN: 0.2500 mg | INTRAMUSCULAR | Status: DC | PRN

## 2014-03-22 MED ORDER — PROPOFOL 10 MG/ML IV BOLUS
INTRAVENOUS | Status: DC | PRN
  Administered 2014-03-22: 150 mg via INTRAVENOUS

## 2014-03-22 MED ORDER — DIPHENHYDRAMINE HCL 12.5 MG/5ML PO ELIX
12.5000 mg | ORAL_SOLUTION | Freq: Four times a day (QID) | ORAL | Status: DC | PRN
  Administered 2014-03-26: 12.5 mg via ORAL
  Filled 2014-03-22: qty 5

## 2014-03-22 MED ORDER — FENTANYL CITRATE 0.05 MG/ML IJ SOLN
INTRAMUSCULAR | Status: DC | PRN
  Administered 2014-03-22 (×3): 50 ug via INTRAVENOUS
  Administered 2014-03-22: 100 ug via INTRAVENOUS

## 2014-03-22 MED ORDER — GLYCOPYRROLATE 0.2 MG/ML IJ SOLN
INTRAMUSCULAR | Status: DC | PRN
  Administered 2014-03-22: 0.4 mg via INTRAVENOUS

## 2014-03-22 MED ORDER — HYDROMORPHONE HCL 1 MG/ML IJ SOLN
INTRAMUSCULAR | Status: DC | PRN
  Administered 2014-03-22 (×2): 1 mg via INTRAVENOUS

## 2014-03-22 MED ORDER — HYDROMORPHONE HCL 2 MG/ML IJ SOLN
INTRAMUSCULAR | Status: AC
  Filled 2014-03-22: qty 1

## 2014-03-22 MED ORDER — MIDAZOLAM HCL 5 MG/5ML IJ SOLN
INTRAMUSCULAR | Status: DC | PRN
  Administered 2014-03-22: 2 mg via INTRAVENOUS

## 2014-03-22 MED ORDER — PROPOFOL 10 MG/ML IV BOLUS
INTRAVENOUS | Status: AC
Start: 2014-03-22 — End: 2014-03-22
  Filled 2014-03-22: qty 20

## 2014-03-22 MED ORDER — ONDANSETRON HCL 4 MG/2ML IJ SOLN
INTRAMUSCULAR | Status: AC
  Filled 2014-03-22: qty 2

## 2014-03-22 MED ORDER — ACETAMINOPHEN 10 MG/ML IV SOLN
1000.0000 mg | Freq: Four times a day (QID) | INTRAVENOUS | Status: AC
  Administered 2014-03-22 – 2014-03-23 (×4): 1000 mg via INTRAVENOUS
  Filled 2014-03-22 (×4): qty 100

## 2014-03-22 MED ORDER — FENTANYL CITRATE 0.05 MG/ML IJ SOLN
INTRAMUSCULAR | Status: AC
  Filled 2014-03-22: qty 5

## 2014-03-22 MED ORDER — MORPHINE SULFATE (PF) 1 MG/ML IV SOLN
INTRAVENOUS | Status: AC
Start: 2014-03-22 — End: 2014-03-22
  Filled 2014-03-22: qty 25

## 2014-03-22 MED ORDER — PNEUMOCOCCAL VAC POLYVALENT 25 MCG/0.5ML IJ INJ
0.5000 mL | INJECTION | Freq: Once | INTRAMUSCULAR | Status: DC
  Filled 2014-03-22: qty 0.5

## 2014-03-22 MED ORDER — DIPHENHYDRAMINE HCL 50 MG/ML IJ SOLN
12.5000 mg | Freq: Four times a day (QID) | INTRAMUSCULAR | Status: DC | PRN
  Administered 2014-03-24 – 2014-03-25 (×2): 12.5 mg via INTRAVENOUS
  Filled 2014-03-22 (×2): qty 1

## 2014-03-22 SURGICAL SUPPLY — 42 items
BLADE EXTENDED COATED 6.5IN (ELECTRODE) ×3 IMPLANT
BLADE HEX COATED 2.75 (ELECTRODE) ×3 IMPLANT
CHLORAPREP W/TINT 26ML (MISCELLANEOUS) ×3 IMPLANT
COVER MAYO STAND STRL (DRAPES) ×3 IMPLANT
DRAIN CHANNEL 19F RND (DRAIN) ×3 IMPLANT
DRAPE LAPAROSCOPIC ABDOMINAL (DRAPES) ×3 IMPLANT
DRAPE UTILITY XL STRL (DRAPES) ×3 IMPLANT
DRAPE WARM FLUID 44X44 (DRAPE) ×3 IMPLANT
DRSG PAD ABDOMINAL 8X10 ST (GAUZE/BANDAGES/DRESSINGS) ×3 IMPLANT
ELECT REM PT RETURN 9FT ADLT (ELECTROSURGICAL) ×3
ELECTRODE REM PT RTRN 9FT ADLT (ELECTROSURGICAL) ×1 IMPLANT
EVACUATOR DRAINAGE 10X20 100CC (DRAIN) ×1 IMPLANT
EVACUATOR SILICONE 100CC (DRAIN) ×2
GAUZE SPONGE 4X4 12PLY STRL (GAUZE/BANDAGES/DRESSINGS) ×3 IMPLANT
GLOVE BIO SURGEON STRL SZ7 (GLOVE) ×3 IMPLANT
GLOVE BIOGEL PI IND STRL 6.5 (GLOVE) ×1 IMPLANT
GLOVE BIOGEL PI IND STRL 7.0 (GLOVE) ×1 IMPLANT
GLOVE BIOGEL PI INDICATOR 6.5 (GLOVE) ×2
GLOVE BIOGEL PI INDICATOR 7.0 (GLOVE) ×2
GLOVE ECLIPSE 8.0 STRL XLNG CF (GLOVE) ×3 IMPLANT
GLOVE INDICATOR 8.0 STRL GRN (GLOVE) ×3 IMPLANT
GOWN STRL REUS W/TWL XL LVL3 (GOWN DISPOSABLE) ×6 IMPLANT
KIT BASIN OR (CUSTOM PROCEDURE TRAY) ×3 IMPLANT
MARKER SKIN DUAL TIP RULER LAB (MISCELLANEOUS) ×3 IMPLANT
PACK GENERAL/GYN (CUSTOM PROCEDURE TRAY) ×3 IMPLANT
SPONGE LAP 18X18 X RAY DECT (DISPOSABLE) IMPLANT
STAPLER VISISTAT 35W (STAPLE) ×3 IMPLANT
SUCTION POOLE TIP (SUCTIONS) ×3 IMPLANT
SUT ETHILON 3 0 PS 1 (SUTURE) ×3 IMPLANT
SUT PDS AB 1 CTX 36 (SUTURE) ×6 IMPLANT
SUT SILK 2 0 (SUTURE) ×2
SUT SILK 2 0 SH CR/8 (SUTURE) ×3 IMPLANT
SUT SILK 2-0 18XBRD TIE 12 (SUTURE) ×1 IMPLANT
SUT SILK 3 0 (SUTURE) ×2
SUT SILK 3 0 SH CR/8 (SUTURE) ×3 IMPLANT
SUT SILK 3-0 18XBRD TIE 12 (SUTURE) ×1 IMPLANT
SUT VIC AB 3-0 SH 18 (SUTURE) IMPLANT
SUT VICRYL 2 0 18  UND BR (SUTURE)
SUT VICRYL 2 0 18 UND BR (SUTURE) IMPLANT
TAPE CLOTH SURG 4X10 WHT LF (GAUZE/BANDAGES/DRESSINGS) ×3 IMPLANT
TOWEL OR 17X26 10 PK STRL BLUE (TOWEL DISPOSABLE) ×6 IMPLANT
TRAY FOLEY CATH 16FRSI W/METER (SET/KITS/TRAYS/PACK) ×3 IMPLANT

## 2014-03-22 NOTE — Progress Notes (Signed)
Day of Surgery  Subjective: Spent 20 minutes with interpreter phone call trying to sort out things.  He does have Hepatitis B, he doesn't know how he got it.   We spent some time talking about getting OOB.  NG working.  Objective: Vital signs in last 24 hours: Temp:  [98.1 F (36.7 C)-99.5 F (37.5 C)] 98.1 F (36.7 C) (12/08 1000) Pulse Rate:  [68-89] 89 (12/08 1000) Resp:  [14-26] 18 (12/08 1150) BP: (136-181)/(71-90) 148/81 mmHg (12/08 1000) SpO2:  [99 %-100 %] 99 % (12/08 1150) Weight:  [61.491 kg (135 lb 9 oz)] 61.491 kg (135 lb 9 oz) (12/08 0841)   NPO 95 from drain NPO Afebrile, VSS Na 136 Intake/Output from previous day: 12/07 0701 - 12/08 0700 In: 1039.6 [I.V.:1039.6] Out: 775 [Urine:630; Drains:95; Blood:50] Intake/Output this shift: Total I/O In: 30 [NG/GT:30] Out: 280 [Urine:225; Emesis/NG output:50; Drains:5]  General appearance: alert, cooperative and mild distress Resp: clear to auscultation bilaterally GI: tender, no BS, dressing is dry.  Lab Results:   Recent Labs  03/21/14 2145 03/22/14 0507  WBC 3.4* 7.4  HGB 14.6 14.0  HCT 41.8 40.4  PLT 84* 78*    BMET  Recent Labs  03/21/14 2145 03/22/14 0507  NA 134* 136*  K 3.4* 4.1  CL 98 101  CO2 24 23  GLUCOSE 126* 125*  BUN 10 9  CREATININE 0.80 0.80  CALCIUM 9.1 8.8   PT/INR No results for input(s): LABPROT, INR in the last 72 hours.   Recent Labs Lab 03/21/14 2145  AST 80*  ALT 137*  ALKPHOS 64  BILITOT 0.9  PROT 7.4  ALBUMIN 3.7     Lipase     Component Value Date/Time   LIPASE 28 03/01/2014 1335     Studies/Results: Ct Abdomen Pelvis W Contrast  03/21/2014   CLINICAL DATA:  RIGHT lower abdominal and pelvic pain 2 hours after eating dinner this evening. History of hepatitis-B.  EXAM: CT ABDOMEN AND PELVIS WITH CONTRAST  TECHNIQUE: Multidetector CT imaging of the abdomen and pelvis was performed using the standard protocol following bolus administration of intravenous  contrast.  CONTRAST:  100mL OMNIPAQUE IOHEXOL 300 MG/ML SOLN, 50mL OMNIPAQUE IOHEXOL 300 MG/ML SOLN  COMPARISON:  None.  FINDINGS: LUNG BASES: Dependent atelectasis without pleural effusion or focal consolidation.  SOLID ORGANS: The liver, spleen, pancreas and adrenal glands are unremarkable. 21 mm gallstone without CT findings of acute cholecystitis.  GASTROINTESTINAL TRACT: The proximal duodenum are approaching extending through the apparent serosa, axial 28/93. Stomach is decompressed. Small amount of small bowel feces. Enteric contrast has not yet reached the distal small bowel. Large bowel is nonsuspicious.  KIDNEYS/ URINARY TRACT: Kidneys are orthotopic, demonstrating symmetric enhancement. No nephrolithiasis, hydronephrosis or solid renal masses. 13 mm cyst LEFT upper pole kidney. The unopacified ureters are normal in course and caliber. Delayed imaging through the kidneys demonstrates symmetric prompt contrast excretion within the proximal urinary collecting system. Urinary bladder is well distended and unremarkable.  PERITONEUM/RETROPERITONEUM: Small to moderate amount of ascites. Moderate pneumoperitoneum within the upper quadrant, predominately in the RIGHT upper quadrant, contiguous with the duodenum bowel patching. Aortoiliac vessels are normal in course and caliber, mild calcific atherosclerosis. No lymphadenopathy by CT size criteria. Internal reproductive organs are unremarkable.  SOFT TISSUE/OSSEOUS STRUCTURES: Nonsuspicious. Subcentimeter RIGHT acetabular bone island. 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  IMPRESSION: Moderate pneumoperitoneum and mild to moderate amount of ascites, this appears to reflect sequelae of perforated duodenal ulcer.  Cholelithiasis without  CT findings of acute cholecystitis.  Acute findings discussed with and reconfirmed by PA Everlene FarrierWILLIAM DANSIE on 03/21/2014 at 11:13 pm.   Electronically Signed   By: Awilda Metroourtnay  Bloomer   On: 03/21/2014 23:14     Medications: . [START ON 03/23/2014] enoxaparin (LOVENOX) injection  40 mg Subcutaneous Q24H  . Influenza vac split quadrivalent PF  0.5 mL Intramuscular Once  . morphine   Intravenous 6 times per day  . morphine      . pantoprazole (PROTONIX) IV  40 mg Intravenous Q12H  . piperacillin-tazobactam (ZOSYN)  IV  3.375 g Intravenous Q8H  . pneumococcal 23 valent vaccine  0.5 mL Intramuscular Once    Assessment/Plan 1.  Perforated duodenal bulb ulcer  Emergency exploratory laparotomy, primary closure of anterior perforated  duodenal bulb ulcer and Cheree DittoGraham patch placement,03/22/2014,  Avel Peaceodd  Rosenbower, MD  2.  Hepatitis B 3.  Tobacco use 4. Thrombocytopenia   Plan:  OOB to chair today ambulate tomorrow, continue PCA, add IV tylenol.  Await bowel function. AST and ALT are up, platelets are down will watch and get medicine involved as needed.    LOS: 1 day    Ritu Gagliardo 03/22/2014

## 2014-03-22 NOTE — H&P (Signed)
Peter Hodge is an 58 y.o. male.   Chief Complaint:   Acute abdominal pain. HPI:  This is a 58 year old [name] with the acute onset of lower abdominal and groin pain today. He presented to the emergency department and underwent a CT scan which demonstrated a pneumoperitoneum. Most of the free air is in the right upper quadrant and above the liver. There appears to be an outpouching around the duodenum that is contiguous with some other free air. No leak of contrast. I was asked to see him because of this. He states he occasionally has had some burning epigastric pain. I have communicated with him through an interpreter.  He also has a family member who is bilingual. No previous operations. He denies taking any medications. He is a smoker.  Past Medical History  Diagnosis Date  . Hepatitis B     History reviewed. No pertinent past surgical history.  History reviewed. No pertinent family history. Social History:  reports that he has been smoking.  He does not have any smokeless tobacco history on file. He reports that he does not drink alcohol or use illicit drugs.  Allergies: No Known Allergies   Prior to Admission medications   Medication Sig Start Date End Date Taking? Authorizing Provider  loperamide (IMODIUM A-D) 2 MG tablet Take 1 tablet (2 mg total) by mouth 4 (four) times daily as needed for diarrhea or loose stools. Patient not taking: Reported on 03/21/2014 03/01/14   Charlesetta Shanks, MD  ondansetron (ZOFRAN) 4 MG tablet Take 1 tablet (4 mg total) by mouth every 4 (four) hours as needed for nausea or vomiting. Patient not taking: Reported on 03/21/2014 03/01/14   Charlesetta Shanks, MD      (Not in a hospital admission)  Results for orders placed or performed during the hospital encounter of 03/21/14 (from the past 48 hour(s))  Comprehensive metabolic panel     Status: Abnormal   Collection Time: 03/21/14  9:45 PM  Result Value Ref Range   Sodium 134 (L) 137 - 147 mEq/L   Potassium 3.4 (L) 3.7 - 5.3 mEq/L   Chloride 98 96 - 112 mEq/L   CO2 24 19 - 32 mEq/L   Glucose, Bld 126 (H) 70 - 99 mg/dL   BUN 10 6 - 23 mg/dL   Creatinine, Ser 0.80 0.50 - 1.35 mg/dL   Calcium 9.1 8.4 - 10.5 mg/dL   Total Protein 7.4 6.0 - 8.3 g/dL   Albumin 3.7 3.5 - 5.2 g/dL   AST 80 (H) 0 - 37 U/L   ALT 137 (H) 0 - 53 U/L   Alkaline Phosphatase 64 39 - 117 U/L   Total Bilirubin 0.9 0.3 - 1.2 mg/dL   GFR calc non Af Amer >90 >90 mL/min   GFR calc Af Amer >90 >90 mL/min    Comment: (NOTE) The eGFR has been calculated using the CKD EPI equation. This calculation has not been validated in all clinical situations. eGFR's persistently <90 mL/min signify possible Chronic Kidney Disease.    Anion gap 12 5 - 15  CBC with Differential     Status: Abnormal   Collection Time: 03/21/14  9:45 PM  Result Value Ref Range   WBC 3.4 (L) 4.0 - 10.5 K/uL   RBC 4.71 4.22 - 5.81 MIL/uL   Hemoglobin 14.6 13.0 - 17.0 g/dL   HCT 41.8 39.0 - 52.0 %   MCV 88.7 78.0 - 100.0 fL   MCH 31.0 26.0 - 34.0 pg  MCHC 34.9 30.0 - 36.0 g/dL   RDW 13.9 11.5 - 15.5 %   Platelets 84 (L) 150 - 400 K/uL    Comment: REPEATED TO VERIFY SPECIMEN CHECKED FOR CLOTS PLATELET COUNT CONFIRMED BY SMEAR    Neutrophils Relative % 80 (H) 43 - 77 %   Lymphocytes Relative 13 12 - 46 %   Monocytes Relative 6 3 - 12 %   Eosinophils Relative 1 0 - 5 %   Basophils Relative 0 0 - 1 %   Neutro Abs 2.8 1.7 - 7.7 K/uL   Lymphs Abs 0.4 (L) 0.7 - 4.0 K/uL   Monocytes Absolute 0.2 0.1 - 1.0 K/uL   Eosinophils Absolute 0.0 0.0 - 0.7 K/uL   Basophils Absolute 0.0 0.0 - 0.1 K/uL   Smear Review MORPHOLOGY UNREMARKABLE    Ct Abdomen Pelvis W Contrast  03/21/2014   CLINICAL DATA:  RIGHT lower abdominal and pelvic pain 2 hours after eating dinner this evening. History of hepatitis-B.  EXAM: CT ABDOMEN AND PELVIS WITH CONTRAST  TECHNIQUE: Multidetector CT imaging of the abdomen and pelvis was performed using the standard protocol  following bolus administration of intravenous contrast.  CONTRAST:  141m OMNIPAQUE IOHEXOL 300 MG/ML SOLN, 510mOMNIPAQUE IOHEXOL 300 MG/ML SOLN  COMPARISON:  None.  FINDINGS: LUNG BASES: Dependent atelectasis without pleural effusion or focal consolidation.  SOLID ORGANS: The liver, spleen, pancreas and adrenal glands are unremarkable. 21 mm gallstone without CT findings of acute cholecystitis.  GASTROINTESTINAL TRACT: The proximal duodenum are approaching extending through the apparent serosa, axial 28/93. Stomach is decompressed. Small amount of small bowel feces. Enteric contrast has not yet reached the distal small bowel. Large bowel is nonsuspicious.  KIDNEYS/ URINARY TRACT: Kidneys are orthotopic, demonstrating symmetric enhancement. No nephrolithiasis, hydronephrosis or solid renal masses. 13 mm cyst LEFT upper pole kidney. The unopacified ureters are normal in course and caliber. Delayed imaging through the kidneys demonstrates symmetric prompt contrast excretion within the proximal urinary collecting system. Urinary bladder is well distended and unremarkable.  PERITONEUM/RETROPERITONEUM: Small to moderate amount of ascites. Moderate pneumoperitoneum within the upper quadrant, predominately in the RIGHT upper quadrant, contiguous with the duodenum bowel patching. Aortoiliac vessels are normal in course and caliber, mild calcific atherosclerosis. No lymphadenopathy by CT size criteria. Internal reproductive organs are unremarkable.  SOFT TISSUE/OSSEOUS STRUCTURES: Nonsuspicious. Subcentimeter RIGHT acetabular bone island. 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  IMPRESSION: Moderate pneumoperitoneum and mild to moderate amount of ascites, this appears to reflect sequelae of perforated duodenal ulcer.  Cholelithiasis without CT findings of acute cholecystitis.  Acute findings discussed with and reconfirmed by PA WIWaynetta Peann 03/21/2014 at 11:13 pm.   Electronically Signed    By: CoElon Alas On: 03/21/2014 23:14    Review of Systems  Unable to perform ROS: language    Blood pressure 145/86, pulse 72, temperature 98.1 F (36.7 C), temperature source Oral, resp. rate 26, SpO2 100 %. Physical Exam  Constitutional: He appears well-developed and well-nourished. He appears distressed.  HENT:  Head: Normocephalic and atraumatic.  Eyes: EOM are normal. No scleral icterus.  Neck: Neck supple.  Cardiovascular: Normal rate.   Respiratory: Effort normal and breath sounds normal.  GI:  Firm with diffuse tenderness to palpation. Hypoactive bowel sounds noted.  Musculoskeletal: He exhibits no edema.  Neurological: He is alert.  Skin: Skin is warm and dry.     Assessment/Plan Acute abdomen with pneumoperitoneum consistent with perforated viscus. Possibilities include perforated ulcer versus  perforated small intestine or colon.  Plan emergency exploratory laparotomy. The procedure and risks have been discussed with him by way of an interpreter. The risks include but are not limited to bleeding, infection, wound healing problems, anesthesia, injury to intra-abdominal organs, need for other operations, and death. He seems to understand the gravity of the situation and agrees to proceed. His family is at the bedside.  Thia Olesen J 03/22/2014, 12:26 AM

## 2014-03-22 NOTE — Op Note (Signed)
Operative Note  Peter Hodge male 58 y.o. 03/22/2014  PREOPERATIVE DX:  Perforated viscus POSTOPERATIVE DX:  Perforated duodenal bulb ulcer  PROCEDURE:   Emergency exploratory laparotomy, primary closure of anterior perforated duodenal bulb ulcer and Cheree DittoGraham patch placement         Surgeon: Adolph PollackOSENBOWER,Mirielle Byrum J   Assistants: none  Anesthesia: General endotracheal anesthesia  Indications:   This is a 58 year old male who developed acute lower abdominal pain. He was brought to the emergency department and noted to have a pneumoperitoneum and signs suspicious for perforated duodenal ulcer. He is now brought to the operating room for emergency export for laparotomy. He has peritonitis on exam.    Procedure Detail:  He was brought to the operating room placed supine on the operating table and general anesthetic was administered. Hair and the abdominal wall was clipped. A Foley catheter and NG tube were placed. The abdominal wall was widely sterilely prepped and draped.  A limited upper midline incision was made dividing the skin and subcutaneous tissue, fascia, peritoneum entered and perineal cavity. A small amount of air was released. I immediately inspected the area of the duodenal bulb and noted a 5 mm hole in the anterior portion leaking some clear fluid. I closed this primarily with 2-0 silk sutures. I then mobilized some omentum and placed it directly over the primary closure and then tied the primary closure sutures together anchoring the omental patch directly over the area of perforation. I then further anchored to the stomach with interrupted 2-0 silk sutures.  I then inspected the stomach and liver. No lesions were noted. There is a gallstone in the gallbladder. The small intestine was without other lesions. There are no obvious palpable colonic lesions. There was purulent fluid in the pelvis and the right upper quadrant. The abdominal cavity was copiously irrigated out with saline and this  was evacuated. A stab wound was made in the right lower quadrant. A 19 Blake drain was placed into the bowel cavity and adjacent to the area of primary closure. It was anchored to the skin with 3-0 nylon suture.  The midline fascia was then closed with running #1 PDS suture. Needle sponginess counts were reported to be correct at that time. The subcutaneous tissues irrigated and skin was closed loosely with staples. A sterile dressing was applied. The drain was put to bulb suction.  He tolerated the procedure without any apparent complications and was taken recovery room in satisfactory condition.  Estimated Blood Loss:  200 mL         Drains: 19 Blake         Specimens: none        Complications:  * No complications entered in OR log *         Disposition: PACU - hemodynamically stable.         Condition: stable

## 2014-03-22 NOTE — Transfer of Care (Signed)
Immediate Anesthesia Transfer of Care Note  Patient: Peter Hodge  Procedure(s) Performed: Procedure(s) (LRB): EXPLORATORY LAPAROTOMY, REPAIR OF PERFORATED ULCER WITH GRAHAM PATCH. (N/A)  Patient Location: PACU  Anesthesia Type: General  Level of Consciousness: sedated, patient cooperative and responds to stimulation  Airway & Oxygen Therapy: Patient Spontanous Breathing and Patient connected to face mask oxgen  Post-op Assessment: Report given to PACU RN and Post -op Vital signs reviewed and stable  Post vital signs: Reviewed and stable  Complications: No apparent anesthesia complications

## 2014-03-22 NOTE — Anesthesia Postprocedure Evaluation (Signed)
  Anesthesia Post-op Note  Patient: Peter Hodge  Procedure(s) Performed: Procedure(s) (LRB): EXPLORATORY LAPAROTOMY, REPAIR OF PERFORATED ULCER WITH GRAHAM PATCH. (N/A)  Patient Location: PACU  Anesthesia Type: General  Level of Consciousness: awake and alert   Airway and Oxygen Therapy: Patient Spontanous Breathing  Post-op Pain: mild  Post-op Assessment: Post-op Vital signs reviewed, Patient's Cardiovascular Status Stable, Respiratory Function Stable, Patent Airway and No signs of Nausea or vomiting  Last Vitals:  Filed Vitals:   03/21/14 2302  BP: 145/86  Pulse: 72  Temp:   Resp: 26    Post-op Vital Signs: stable   Complications: No apparent anesthesia complications

## 2014-03-22 NOTE — Anesthesia Preprocedure Evaluation (Addendum)
Anesthesia Evaluation  Patient identified by MRN, date of birth, ID band Patient awake    Reviewed: Allergy & Precautions, H&P , NPO status , Patient's Chart, lab work & pertinent test results  History of Anesthesia Complications Negative for: history of anesthetic complications  Airway Mallampati: II  TM Distance: >3 FB Neck ROM: Full    Dental no notable dental hx. 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   Neuro/Psych negative neurological ROS  negative psych ROS   GI/Hepatic negative GI ROS, (+) Hepatitis -, B  Endo/Other  negative endocrine ROS  Renal/GU negative Renal ROS  negative genitourinary   Musculoskeletal negative musculoskeletal ROS (+)   Abdominal   Peds negative pediatric ROS (+)  Hematology negative hematology ROS (+)   Anesthesia Other Findings   Reproductive/Obstetrics negative OB ROS                          Anesthesia Physical Anesthesia Plan  ASA: III and emergent  Anesthesia Plan: General   Post-op Pain Management:    Induction: Cricoid pressure planned, Rapid sequence and Intravenous  Airway Management Planned: Oral ETT  Additional Equipment:   Intra-op Plan:   Post-operative Plan: Extubation in OR  Informed Consent: I have reviewed the patients History and Physical, chart, labs and discussed the procedure including the risks, benefits and alternatives for the proposed anesthesia with the patient or authorized representative who has indicated his/her understanding and acceptance.   Dental advisory given  Plan Discussed with: CRNA  Anesthesia Plan Comments:        Anesthesia Quick Evaluation

## 2014-03-23 ENCOUNTER — Encounter (HOSPITAL_COMMUNITY): Payer: Self-pay | Admitting: General Surgery

## 2014-03-23 LAB — COMPREHENSIVE METABOLIC PANEL
ALBUMIN: 2.9 g/dL — AB (ref 3.5–5.2)
ALK PHOS: 52 U/L (ref 39–117)
ALT: 100 U/L — AB (ref 0–53)
ANION GAP: 8 (ref 5–15)
AST: 68 U/L — ABNORMAL HIGH (ref 0–37)
BUN: 14 mg/dL (ref 6–23)
CO2: 27 mEq/L (ref 19–32)
Calcium: 9.1 mg/dL (ref 8.4–10.5)
Chloride: 98 mEq/L (ref 96–112)
Creatinine, Ser: 0.97 mg/dL (ref 0.50–1.35)
GFR calc Af Amer: >90 mL/min (ref >90)
GFR calc non Af Amer: 90 mL/min — ABNORMAL LOW (ref >90)
Glucose, Bld: 103 mg/dL — ABNORMAL HIGH (ref 70–99)
POTASSIUM: 4 meq/L (ref 3.7–5.3)
Sodium: 133 mEq/L — ABNORMAL LOW (ref 137–147)
TOTAL PROTEIN: 6.5 g/dL (ref 6.0–8.3)
Total Bilirubin: 2.3 mg/dL — ABNORMAL HIGH (ref 0.3–1.2)

## 2014-03-23 LAB — HEPATITIS PANEL, ACUTE
HCV AB: NEGATIVE
HEP A IGM: NONREACTIVE
HEP B C IGM: REACTIVE — AB
HEP B S AG: POSITIVE — AB

## 2014-03-23 LAB — CBC
HCT: 38.9 % — ABNORMAL LOW (ref 39.0–52.0)
Hemoglobin: 13 g/dL (ref 13.0–17.0)
MCH: 30.6 pg (ref 26.0–34.0)
MCHC: 33.4 g/dL (ref 30.0–36.0)
MCV: 91.5 fL (ref 78.0–100.0)
PLATELETS: 71 10*3/uL — AB (ref 150–400)
RBC: 4.25 MIL/uL (ref 4.22–5.81)
RDW: 14.5 % (ref 11.5–15.5)
WBC: 6.7 10*3/uL (ref 4.0–10.5)

## 2014-03-23 LAB — HEPATITIS B SURF AG CONFIRMATION: Hepatitis B Surf Ag Confirmation: POSITIVE — AB

## 2014-03-23 NOTE — Progress Notes (Signed)
1 Day Post-Op  Subjective: I checked his hepatitis panel, ED notes show a history of this, but I don't see where he is followed by anyone for it.  I ask him with interpreter and he says he saw someone but is out of medicine.  He has no idea about who or when to follow up.  He is passing gas and feeling better from surgery   Objective: Vital signs in last 24 hours: Temp:  [97.9 F (36.6 C)-99.6 F (37.6 C)] 98.1 F (36.7 C) (12/09 1000) Pulse Rate:  [62-80] 68 (12/09 1000) Resp:  [10-20] 20 (12/09 1131) BP: (111-142)/(49-82) 140/68 mmHg (12/09 1000) SpO2:  [92 %-100 %] 98 % (12/09 1131)  120 from NG, 40 from drain, NPO Afebrile, VSS Labs stable Hepatitis B Core antigen is reactive Intake/Output from previous day: 12/08 0701 - 12/09 0700 In: 1423.7 [I.V.:943.7; NG/GT:30; IV Piggyback:450] Out: 1560 [Urine:1400; Emesis/NG output:120; Drains:40] Intake/Output this shift:    General appearance: alert, cooperative and no distress Resp: clear to auscultation bilaterally GI: sore flat abdomen rare bs.  site looks good.  Lab Results:   Recent Labs  03/22/14 0507 03/23/14 0436  WBC 7.4 6.7  HGB 14.0 13.0  HCT 40.4 38.9*  PLT 78* 71*    BMET  Recent Labs  03/22/14 0507 03/23/14 0436  NA 136* 133*  K 4.1 4.0  CL 101 98  CO2 23 27  GLUCOSE 125* 103*  BUN 9 14  CREATININE 0.80 0.97  CALCIUM 8.8 9.1   PT/INR No results for input(s): LABPROT, INR in the last 72 hours.   Recent Labs Lab 03/21/14 2145 03/23/14 0436  AST 80* 68*  ALT 137* 100*  ALKPHOS 64 52  BILITOT 0.9 2.3*  PROT 7.4 6.5  ALBUMIN 3.7 2.9*     Lipase     Component Value Date/Time   LIPASE 28 03/01/2014 1335     Studies/Results: Ct Abdomen Pelvis W Contrast  03/21/2014   CLINICAL DATA:  RIGHT lower abdominal and pelvic pain 2 hours after eating dinner this evening. History of hepatitis-B.  EXAM: CT ABDOMEN AND PELVIS WITH CONTRAST  TECHNIQUE: Multidetector CT imaging of the abdomen and  pelvis was performed using the standard protocol following bolus administration of intravenous contrast.  CONTRAST:  100mL OMNIPAQUE IOHEXOL 300 MG/ML SOLN, 50mL OMNIPAQUE IOHEXOL 300 MG/ML SOLN  COMPARISON:  None.  FINDINGS: LUNG BASES: Dependent atelectasis without pleural effusion or focal consolidation.  SOLID ORGANS: The liver, spleen, pancreas and adrenal glands are unremarkable. 21 mm gallstone without CT findings of acute cholecystitis.  GASTROINTESTINAL TRACT: The proximal duodenum are approaching extending through the apparent serosa, axial 28/93. Stomach is decompressed. Small amount of small bowel feces. Enteric contrast has not yet reached the distal small bowel. Large bowel is nonsuspicious.  KIDNEYS/ URINARY TRACT: Kidneys are orthotopic, demonstrating symmetric enhancement. No nephrolithiasis, hydronephrosis or solid renal masses. 13 mm cyst LEFT upper pole kidney. The unopacified ureters are normal in course and caliber. Delayed imaging through the kidneys demonstrates symmetric prompt contrast excretion within the proximal urinary collecting system. Urinary bladder is well distended and unremarkable.  PERITONEUM/RETROPERITONEUM: Small to moderate amount of ascites. Moderate pneumoperitoneum within the upper quadrant, predominately in the RIGHT upper quadrant, contiguous with the duodenum bowel patching. Aortoiliac vessels are normal in course and caliber, mild calcific atherosclerosis. No lymphadenopathy by CT size criteria. Internal reproductive organs are unremarkable.  SOFT TISSUE/OSSEOUS STRUCTURES: Nonsuspicious. Subcentimeter RIGHT acetabular bone island. 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  IMPRESSION: Moderate pneumoperitoneum and mild to moderate amount of ascites, this appears to reflect sequelae of perforated duodenal ulcer.  Cholelithiasis without CT findings of acute cholecystitis.  Acute findings discussed with and reconfirmed by PA Everlene FarrierWILLIAM DANSIE  on 03/21/2014 at 11:13 pm.   Electronically Signed   By: Awilda Metroourtnay  Bloomer   On: 03/21/2014 23:14    Medications: . enoxaparin (LOVENOX) injection  40 mg Subcutaneous Q24H  . Influenza vac split quadrivalent PF  0.5 mL Intramuscular Once  . morphine   Intravenous 6 times per day  . pantoprazole (PROTONIX) IV  40 mg Intravenous Q12H  . piperacillin-tazobactam (ZOSYN)  IV  3.375 g Intravenous Q8H  . pneumococcal 23 valent vaccine  0.5 mL Intramuscular Once    Assessment/Plan 1. Perforated duodenal bulb ulcer Emergency exploratory laparotomy, primary closure of anterior perforated duodenal bulb ulcer and Cheree DittoGraham patch placement,03/22/2014, Peter Peaceodd Rosenbower, MD  2. Hepatitis B 3. Tobacco use 4. Thrombocytopenia   Plan:  Pull foley, continue antibiotics. Check on when to do UGI  To look for a leak.     LOS: 2 days    Peter Hodge 03/23/2014

## 2014-03-24 MED ORDER — PNEUMOCOCCAL VAC POLYVALENT 25 MCG/0.5ML IJ INJ
0.5000 mL | INJECTION | INTRAMUSCULAR | Status: DC | PRN
  Filled 2014-03-24 (×2): qty 0.5

## 2014-03-24 MED ORDER — INFLUENZA VAC SPLIT QUAD 0.5 ML IM SUSY
0.5000 mL | PREFILLED_SYRINGE | INTRAMUSCULAR | Status: DC | PRN
  Filled 2014-03-24 (×2): qty 0.5

## 2014-03-24 NOTE — Plan of Care (Signed)
Problem: Phase II Progression Outcomes Goal: Progressing with IS, TCDB Outcome: Completed/Met Date Met:  03/24/14

## 2014-03-24 NOTE — Plan of Care (Signed)
Problem: Phase I Progression Outcomes Goal: Pain controlled with appropriate interventions Outcome: Completed/Met Date Met:  05/19/13

## 2014-03-24 NOTE — Plan of Care (Signed)
Problem: Phase I Progression Outcomes Goal: OOB as tolerated unless otherwise ordered Outcome: Completed/Met Date Met:  03/24/14     

## 2014-03-24 NOTE — Progress Notes (Signed)
Patient ID: Peter Hodge Doctorhaing Frandsen, male   DOB: 06/29/1955, 58 y.o.   MRN: [id number] 2 Days Post-Op  Subjective: Pt with no c/o via translator.  Passing flatus.mobilizing   Objective: Vital signs in last 24 hours: Temp:  [98 F (36.7 C)-99.3 F (37.4 C)] 99.3 F (37.4 C) (12/10 1000) Pulse Rate:  [70-76] 71 (12/10 1000) Resp:  [9-18] 16 (12/10 1000) BP: (136-143)/(74-83) 136/83 mmHg (12/10 1000) SpO2:  [96 %-100 %] 100 % (12/10 1000) Last BM Date: 03/23/14  Intake/Output from previous day: 12/09 0701 - 12/10 0700 In: 3032.5 [I.V.:2882.5; IV Piggyback:150] Out: 1500 [Urine:1175; Emesis/NG output:290; Drains:35] Intake/Output this shift: Total I/O In: 20 [P.O.:20] Out: 580 [Urine:575; Drains:5]  PE: Abd: soft, minimally tender, few BS, incision c/d/i with staples intact, JP with minimal serous drainage  Lab Results:   Recent Labs  03/22/14 0507 03/23/14 0436  WBC 7.4 6.7  HGB 14.0 13.0  HCT 40.4 38.9*  PLT 78* 71*   BMET  Recent Labs  03/22/14 0507 03/23/14 0436  NA 136* 133*  K 4.1 4.0  CL 101 98  CO2 23 27  GLUCOSE 125* 103*  BUN 9 14  CREATININE 0.80 0.97  CALCIUM 8.8 9.1   PT/INR No results for input(s): LABPROT, INR in the last 72 hours. CMP     Component Value Date/Time   NA 133* 03/23/2014 0436   K 4.0 03/23/2014 0436   CL 98 03/23/2014 0436   CO2 27 03/23/2014 0436   GLUCOSE 103* 03/23/2014 0436   BUN 14 03/23/2014 0436   CREATININE 0.97 03/23/2014 0436   CALCIUM 9.1 03/23/2014 0436   PROT 6.5 03/23/2014 0436   ALBUMIN 2.9* 03/23/2014 0436   AST 68* 03/23/2014 0436   ALT 100* 03/23/2014 0436   ALKPHOS 52 03/23/2014 0436   BILITOT 2.3* 03/23/2014 0436   GFRNONAA 90* 03/23/2014 0436   GFRAA >90 03/23/2014 0436   Lipase     Component Value Date/Time   LIPASE 28 03/01/2014 1335       Studies/Results: No results found.  Anti-infectives: Anti-infectives    Start     Dose/Rate Route Frequency Ordered Stop   03/22/14 0800   piperacillin-tazobactam (ZOSYN) IVPB 3.375 g     3.375 g12.5 mL/hr over 240 Minutes Intravenous Every 8 hours 03/22/14 0411     03/21/14 2315  piperacillin-tazobactam (ZOSYN) IVPB 3.375 g     3.375 g12.5 mL/hr over 240 Minutes Intravenous  Once 03/21/14 2309 03/22/14 0342       Assessment/Plan   1. Perforated duodenal bulb ulcer Emergency exploratory laparotomy, primary closure of anterior perforated duodenal bulb ulcer and Graham patch placement, POD 2 2. Hepatitis B 3. Tobacco use 4. Thrombocytopenia  Plan:  1. Patient doing well.  Cont NGT and will need UGI to evaluate for a leak prior to NG removal. 2. Follow platelets 3. Cont to mobilize 4. Cont abx therapy  LOS: 3 days    Peter Hodge 03/24/2014, 11:51 AM Pager: [phone]

## 2014-03-24 NOTE — Plan of Care (Signed)
Problem: Phase I Progression Outcomes Goal: Incision/dressings dry and intact Outcome: Completed/Met Date Met:  03/24/14 Goal: Vital signs/hemodynamically stable Outcome: Completed/Met Date Met:  03/24/14  Problem: Phase II Progression Outcomes Goal: Pain controlled Outcome: Completed/Met Date Met:  03/24/14 Goal: Progress activity as tolerated unless otherwise ordered Outcome: Completed/Met Date Met:  03/24/14 Goal: Vital signs stable Outcome: Completed/Met Date Met:  03/24/14 Goal: Surgical site without signs of infection Outcome: Completed/Met Date Met:  03/24/14 Goal: Foley discontinued Outcome: Not Applicable Date Met:  73/56/70

## 2014-03-24 NOTE — Plan of Care (Signed)
Problem: Phase I Progression Outcomes Goal: Tubes/drains patent Outcome: Completed/Met Date Met:  03/24/14

## 2014-03-24 NOTE — Plan of Care (Signed)
Problem: Phase I Progression Outcomes Goal: Voiding-avoid urinary catheter unless indicated Outcome: Completed/Met Date Met:  03/24/14     

## 2014-03-25 ENCOUNTER — Inpatient Hospital Stay (HOSPITAL_COMMUNITY): Payer: MEDICAID

## 2014-03-25 LAB — CBC
HCT: 35.6 % — ABNORMAL LOW (ref 39.0–52.0)
HEMOGLOBIN: 12.1 g/dL — AB (ref 13.0–17.0)
MCH: 30.5 pg (ref 26.0–34.0)
MCHC: 34 g/dL (ref 30.0–36.0)
MCV: 89.7 fL (ref 78.0–100.0)
Platelets: 77 10*3/uL — ABNORMAL LOW (ref 150–400)
RBC: 3.97 MIL/uL — ABNORMAL LOW (ref 4.22–5.81)
RDW: 13.6 % (ref 11.5–15.5)
WBC: 3.3 10*3/uL — ABNORMAL LOW (ref 4.0–10.5)

## 2014-03-25 MED ORDER — IOHEXOL 300 MG/ML  SOLN
100.0000 mL | Freq: Once | INTRAMUSCULAR | Status: AC | PRN
  Administered 2014-03-25: 170 mL via ORAL

## 2014-03-25 NOTE — Progress Notes (Signed)
Patient ID: Peter Hodge, male   DOB: 09/24/1955, 58 y.o.   MRN: [id number] 3 Days Post-Op  Subjective: Pt feels better today.  Small amount of flatus  Objective: Vital signs in last 24 hours: Temp:  [98.8 F (37.1 C)-99.6 F (37.6 C)] 98.8 F (37.1 C) (12/11 1000) Pulse Rate:  [61-78] 61 (12/11 1000) Resp:  [9-21] 18 (12/11 1000) BP: (120-140)/(60-77) 127/76 mmHg (12/11 1000) SpO2:  [95 %-100 %] 100 % (12/11 1000) Last BM Date: 03/23/14  Intake/Output from previous day: 12/10 0701 - 12/11 0700 In: 2415 [P.O.:40; I.V.:2375] Out: 2404 [Urine:1925; Emesis/NG output:450; Drains:29] Intake/Output this shift: Total I/O In: -  Out: 535 [Urine:475; Emesis/NG output:50; Drains:10]  PE: Abd: soft, minimally tender, incision c/d/i with staples, JP drain with no bilious output only minimal serous drainage, few BS.  NGT with 450cc of output yesterday  Lab Results:   Recent Labs  03/23/14 0436 03/25/14 0525  WBC 6.7 3.3*  HGB 13.0 12.1*  HCT 38.9* 35.6*  PLT 71* 77*   BMET  Recent Labs  03/23/14 0436  NA 133*  K 4.0  CL 98  CO2 27  GLUCOSE 103*  BUN 14  CREATININE 0.97  CALCIUM 9.1   PT/INR No results for input(s): LABPROT, INR in the last 72 hours. CMP     Component Value Date/Time   NA 133* 03/23/2014 0436   K 4.0 03/23/2014 0436   CL 98 03/23/2014 0436   CO2 27 03/23/2014 0436   GLUCOSE 103* 03/23/2014 0436   BUN 14 03/23/2014 0436   CREATININE 0.97 03/23/2014 0436   CALCIUM 9.1 03/23/2014 0436   PROT 6.5 03/23/2014 0436   ALBUMIN 2.9* 03/23/2014 0436   AST 68* 03/23/2014 0436   ALT 100* 03/23/2014 0436   ALKPHOS 52 03/23/2014 0436   BILITOT 2.3* 03/23/2014 0436   GFRNONAA 90* 03/23/2014 0436   GFRAA >90 03/23/2014 0436   Lipase     Component Value Date/Time   LIPASE 28 03/01/2014 1335       Studies/Results: No results found.  Anti-infectives: Anti-infectives    Start     Dose/Rate Route Frequency Ordered Stop   03/22/14 0800   piperacillin-tazobactam (ZOSYN) IVPB 3.375 g     3.375 g12.5 mL/hr over 240 Minutes Intravenous Every 8 hours 03/22/14 0411     03/21/14 2315  piperacillin-tazobactam (ZOSYN) IVPB 3.375 g     3.375 g12.5 mL/hr over 240 Minutes Intravenous  Once 03/21/14 2309 03/22/14 0342       Assessment/Plan  POD 3, s/p ex lap with repair of perforated duodenal ulcer with graham patch Thrombocytopenia  Hep B  Plan: 1. Will get an UGI today to evaluate for leak.  If negative, may be able to consider dc of NGT later today or tomorrow. 2. Cont abx therapy  3. Cont pulm toilet and mobilize   LOS: 4 days    Destenie Ingber E 03/25/2014, 10:38 AM Pager: [phone]

## 2014-03-26 MED ORDER — LIP MEDEX EX OINT
TOPICAL_OINTMENT | CUTANEOUS | Status: AC
  Administered 2014-03-26: 13:00:00
  Filled 2014-03-26: qty 7

## 2014-03-26 NOTE — Progress Notes (Signed)
4 Days Post-Op  Subjective: UGI no sign of leak; limited passage of contrast through the duodenum - probably still some edema around repair  Objective: Vital signs in last 24 hours: Temp:  [97.4 F (36.3 C)-98.8 F (37.1 C)] 98.2 F (36.8 C) (12/12 0630) Pulse Rate:  [54-65] 61 (12/12 0630) Resp:  [5-18] 15 (12/12 0630) BP: (127-156)/(71-81) 156/79 mmHg (12/12 0630) SpO2:  [95 %-100 %] 100 % (12/12 0630) Last BM Date: 03/23/14  Intake/Output from previous day: 12/11 0701 - 12/12 0700 In: 2410 [P.O.:35; I.V.:2125; IV Piggyback:250] Out: 3598 [Urine:2575; Emesis/NG output:1000; Drains:23] Intake/Output this shift:    General appearance: alert, cooperative and no distress Resp: clear to auscultation bilaterally Cardio: regular rate and rhythm, S1, S2 normal, no murmur, click, rub or gallop GI: soft, + BS; minimal tenderness; drain - clear serous Midline wound - c/d/i  Lab Results:   Recent Labs  03/25/14 0525  WBC 3.3*  HGB 12.1*  HCT 35.6*  PLT 77*   BMET No results for input(s): NA, K, CL, CO2, GLUCOSE, BUN, CREATININE, CALCIUM in the last 72 hours. PT/INR No results for input(s): LABPROT, INR in the last 72 hours. ABG No results for input(s): PHART, HCO3 in the last 72 hours.  Invalid input(s): PCO2, PO2  Studies/Results: Dg Ugi W/water Sol Cm  03/25/2014   CLINICAL DATA:  Status post repair of perforated duodenal ulcer 03/22/2014.  EXAM: WATER SOLUBLE UPPER GI SERIES  TECHNIQUE: Single-column upper GI series was performed using water soluble contrast.  CONTRAST:  170mL OMNIPAQUE IOHEXOL 300 MG/ML  SOLN  COMPARISON:  Preoperative CT 03/21/2014.  FLUOROSCOPY TIME:  1 min and 25 seconds.  FINDINGS: The scout abdominal radiograph demonstrates a nasogastric tube in the left upper quadrant of the abdomen, a mid abdominal surgical drain, a large calcified gallstone and residual contrast in the right colon. There is a small amount of air beneath the right hemidiaphragm.   Study was performed with the aid of an interpreter over the telephone due to language barrier. The patient initially drank approximately 100 ml of the contrast. The esophageal motility appears within normal limits. There is normal filling of the stomach with the majority contrast pooling in the fundus. The patient was placed in the semi-erect and right lateral decubitus positions with limited emptying of the stomach.  Subsequently, additional contrast was injected via the nasogastric tube. Overall, the patient received approximately 170 ml of contrast. There was eventual opacification of the duodenum and no evidence extravasation from the duodenal bulb. Despite intermittent observation in the erect and decubitus positions, no significant contrast was seen to pass the spine into the fourth portion of the duodenum. There is no significant proximal small bowel distension.  IMPRESSION: 1. Interval repair of perforated duodenal ulcer. 2. No evidence of contrast extravasation or obstruction. There is limited passage of contrast beyond the third portion of the duodenum.   Electronically Signed   By: Roxy HorsemanBill  Veazey M.D.   On: 03/25/2014 16:16    Anti-infectives: Anti-infectives    Start     Dose/Rate Route Frequency Ordered Stop   03/22/14 0800  piperacillin-tazobactam (ZOSYN) IVPB 3.375 g     3.375 g12.5 mL/hr over 240 Minutes Intravenous Every 8 hours 03/22/14 0411     03/21/14 2315  piperacillin-tazobactam (ZOSYN) IVPB 3.375 g     3.375 g12.5 mL/hr over 240 Minutes Intravenous  Once 03/21/14 2309 03/22/14 0342      Assessment/Plan: s/p Procedure(s): EXPLORATORY LAPAROTOMY, REPAIR OF PERFORATED ULCER WITH [facility] PATCH  TECHNIQUE (N/A) Remove NG tube today  Clear liquids Ambulate Leave drain in for now until taking PO's   LOS: 5 days    Peter Hodge K. 03/26/2014

## 2014-03-27 MED ORDER — METOCLOPRAMIDE HCL 5 MG/ML IJ SOLN
10.0000 mg | Freq: Four times a day (QID) | INTRAMUSCULAR | Status: DC
  Administered 2014-03-27 – 2014-03-28 (×6): 10 mg via INTRAVENOUS
  Filled 2014-03-27 (×9): qty 2

## 2014-03-27 MED ORDER — OXYCODONE HCL 5 MG/5ML PO SOLN
5.0000 mg | ORAL | Status: DC | PRN
  Administered 2014-03-28 (×2): 5 mg via ORAL
  Filled 2014-03-27 (×3): qty 5

## 2014-03-27 MED ORDER — MORPHINE SULFATE 2 MG/ML IJ SOLN: 2.0000 mg | INTRAMUSCULAR | Status: DC | PRN

## 2014-03-27 NOTE — Progress Notes (Signed)
5 Days Post-Op  Subjective: Patient comfortable, tolerating clear liquids No complaints of pain Drain output - serosanguinous  Objective: Vital signs in last 24 hours: Temp:  [97.6 F (36.4 C)-98.6 F (37 C)] 98.5 F (36.9 C) (12/13 0631) Pulse Rate:  [59-74] 74 (12/13 0631) Resp:  [14-20] 18 (12/13 0631) BP: (134-148)/(72-87) 134/72 mmHg (12/13 0631) SpO2:  [95 %-100 %] 98 % (12/13 0631) Last BM Date: 03/23/14  Intake/Output from previous day: 12/12 0701 - 12/13 0700 In: 1595 [P.O.:120; I.V.:1375; IV Piggyback:100] Out: 1955 [Urine:1800; Emesis/NG output:150; Drains:5] Intake/Output this shift:    General appearance: alert, cooperative and no distress Resp: clear to auscultation bilaterally Cardio: regular rate and rhythm, S1, S2 normal, no murmur, click, rub or gallop GI: soft, non-tender; + BS Incision c/d/i  Lab Results:   Recent Labs  03/25/14 0525  WBC 3.3*  HGB 12.1*  HCT 35.6*  PLT 77*   BMET No results for input(s): NA, K, CL, CO2, GLUCOSE, BUN, CREATININE, CALCIUM in the last 72 hours. PT/INR No results for input(s): LABPROT, INR in the last 72 hours. ABG No results for input(s): PHART, HCO3 in the last 72 hours.  Invalid input(s): PCO2, PO2  Studies/Results: Dg Ugi W/water Sol Cm  03/25/2014   CLINICAL DATA:  Status post repair of perforated duodenal ulcer 03/22/2014.  EXAM: WATER SOLUBLE UPPER GI SERIES  TECHNIQUE: Single-column upper GI series was performed using water soluble contrast.  CONTRAST:  170mL OMNIPAQUE IOHEXOL 300 MG/ML  SOLN  COMPARISON:  Preoperative CT 03/21/2014.  FLUOROSCOPY TIME:  1 min and 25 seconds.  FINDINGS: The scout abdominal radiograph demonstrates a nasogastric tube in the left upper quadrant of the abdomen, a mid abdominal surgical drain, a large calcified gallstone and residual contrast in the right colon. There is a small amount of air beneath the right hemidiaphragm.  Study was performed with the aid of an interpreter  over the telephone due to language barrier. The patient initially drank approximately 100 ml of the contrast. The esophageal motility appears within normal limits. There is normal filling of the stomach with the majority contrast pooling in the fundus. The patient was placed in the semi-erect and right lateral decubitus positions with limited emptying of the stomach.  Subsequently, additional contrast was injected via the nasogastric tube. Overall, the patient received approximately 170 ml of contrast. There was eventual opacification of the duodenum and no evidence extravasation from the duodenal bulb. Despite intermittent observation in the erect and decubitus positions, no significant contrast was seen to pass the spine into the fourth portion of the duodenum. There is no significant proximal small bowel distension.  IMPRESSION: 1. Interval repair of perforated duodenal ulcer. 2. No evidence of contrast extravasation or obstruction. There is limited passage of contrast beyond the third portion of the duodenum.   Electronically Signed   By: Roxy HorsemanBill  Veazey M.D.   On: 03/25/2014 16:16    Anti-infectives: Anti-infectives    Start     Dose/Rate Route Frequency Ordered Stop   03/22/14 0800  piperacillin-tazobactam (ZOSYN) IVPB 3.375 g     3.375 g12.5 mL/hr over 240 Minutes Intravenous Every 8 hours 03/22/14 0411     03/21/14 2315  piperacillin-tazobactam (ZOSYN) IVPB 3.375 g     3.375 g12.5 mL/hr over 240 Minutes Intravenous  Once 03/21/14 2309 03/22/14 0342      Assessment/Plan: s/p Procedure(s): EXPLORATORY LAPAROTOMY, REPAIR OF PERFORATED ULCER WITH GRAHAM PATCH TECHNIQUE (N/A) Plan for discharge tomorrow Advance to full liquids  D/c PCA -  Oxycodone elixir Probably pull drain prior to discharge.   LOS: 6 days    Abagail Limb K. 03/27/2014

## 2014-03-28 ENCOUNTER — Encounter (HOSPITAL_COMMUNITY): Payer: Self-pay | Admitting: *Deleted

## 2014-03-28 ENCOUNTER — Encounter (HOSPITAL_COMMUNITY): Payer: Self-pay | Admitting: General Surgery

## 2014-03-28 ENCOUNTER — Encounter (INDEPENDENT_AMBULATORY_CARE_PROVIDER_SITE_OTHER): Payer: Self-pay

## 2014-03-28 DIAGNOSIS — D696 Thrombocytopenia, unspecified: Secondary | ICD-10-CM

## 2014-03-28 DIAGNOSIS — F17200 Nicotine dependence, unspecified, uncomplicated: Secondary | ICD-10-CM | POA: Diagnosis present

## 2014-03-28 DIAGNOSIS — B1910 Unspecified viral hepatitis B without hepatic coma: Secondary | ICD-10-CM

## 2014-03-28 HISTORY — DX: Unspecified viral hepatitis B without hepatic coma: B19.10

## 2014-03-28 HISTORY — DX: Nicotine dependence, unspecified, uncomplicated: F17.200

## 2014-03-28 HISTORY — DX: Thrombocytopenia, unspecified: D69.6

## 2014-03-28 LAB — CBC
HCT: 37.6 % — ABNORMAL LOW (ref 39.0–52.0)
Hemoglobin: 13.1 g/dL (ref 13.0–17.0)
MCH: 31.1 pg (ref 26.0–34.0)
MCHC: 34.8 g/dL (ref 30.0–36.0)
MCV: 89.3 fL (ref 78.0–100.0)
PLATELETS: 105 10*3/uL — AB (ref 150–400)
RBC: 4.21 MIL/uL — AB (ref 4.22–5.81)
RDW: 13.5 % (ref 11.5–15.5)
WBC: 3.4 10*3/uL — ABNORMAL LOW (ref 4.0–10.5)

## 2014-03-28 LAB — COMPREHENSIVE METABOLIC PANEL
ALK PHOS: 71 U/L (ref 39–117)
ALT: 64 U/L — AB (ref 0–53)
AST: 48 U/L — AB (ref 0–37)
Albumin: 2.9 g/dL — ABNORMAL LOW (ref 3.5–5.2)
Anion gap: 11 (ref 5–15)
BUN: 5 mg/dL — ABNORMAL LOW (ref 6–23)
CO2: 25 meq/L (ref 19–32)
Calcium: 9.2 mg/dL (ref 8.4–10.5)
Chloride: 99 mEq/L (ref 96–112)
Creatinine, Ser: 0.74 mg/dL (ref 0.50–1.35)
GFR calc non Af Amer: >90 mL/min (ref >90)
GLUCOSE: 141 mg/dL — AB (ref 70–99)
POTASSIUM: 4.4 meq/L (ref 3.7–5.3)
SODIUM: 135 meq/L — AB (ref 137–147)
TOTAL PROTEIN: 7.3 g/dL (ref 6.0–8.3)
Total Bilirubin: 1.2 mg/dL (ref 0.3–1.2)

## 2014-03-28 MED ORDER — AMOXICILLIN-POT CLAVULANATE 875-125 MG PO TABS
1.0000 | ORAL_TABLET | Freq: Two times a day (BID) | ORAL | Status: DC
  Administered 2014-03-28: 1 via ORAL
  Filled 2014-03-28 (×2): qty 1

## 2014-03-28 MED ORDER — OXYCODONE-ACETAMINOPHEN 5-325 MG PO TABS: 1.0000 | ORAL_TABLET | ORAL | Status: DC | PRN

## 2014-03-28 MED ORDER — PANTOPRAZOLE SODIUM 40 MG PO TBEC: 40.0000 mg | DELAYED_RELEASE_TABLET | Freq: Two times a day (BID) | ORAL | Status: AC

## 2014-03-28 MED ORDER — ACETAMINOPHEN 325 MG PO TABS: 650.0000 mg | ORAL_TABLET | Freq: Four times a day (QID) | ORAL | Status: AC | PRN

## 2014-03-28 MED ORDER — PANTOPRAZOLE SODIUM 40 MG PO TBEC
40.0000 mg | DELAYED_RELEASE_TABLET | Freq: Two times a day (BID) | ORAL | Status: DC
  Administered 2014-03-28: 40 mg via ORAL
  Filled 2014-03-28: qty 1

## 2014-03-28 NOTE — Progress Notes (Signed)
6 Days Post-Op  Subjective: He seems better, I used interpreter phone to talk to him.  He has full liquid diet on tray in front of him.  I again tried to narrow down who is following his Hepatitis B.  Objective: Vital signs in last 24 hours: Temp:  [98.1 F (36.7 C)-98.5 F (36.9 C)] 98.4 F (36.9 C) (12/14 0545) Pulse Rate:  [62-74] 69 (12/14 0545) Resp:  [18] 18 (12/14 0545) BP: (137-144)/(65-74) 137/73 mmHg (12/14 0545) SpO2:  [98 %-100 %] 98 % (12/14 0545) Last BM Date: 03/28/14 840 PO 1 BM 20 from the drain I started a bland diet this AM Day 8 of antibiotics AFEBRILE, VSS,  Labs OK on 12/11, none since Intake/Output from previous day: 12/13 0701 - 12/14 0700 In: 4540 [P.O.:840; I.V.:3500; IV Piggyback:200] Out: 520 [Urine:500; Drains:20] Intake/Output this shift:    General appearance: alert, cooperative and no distress Resp: clear to auscultation bilaterally Male genitalia: normal, soft sore, incision looks fine, no drainage from JP this AM  Lab Results:  No results for input(s): WBC, HGB, HCT, PLT in the last 72 hours.  BMET No results for input(s): NA, K, CL, CO2, GLUCOSE, BUN, CREATININE, CALCIUM in the last 72 hours. PT/INR No results for input(s): LABPROT, INR in the last 72 hours.   Recent Labs Lab 03/21/14 2145 03/23/14 0436  AST 80* 68*  ALT 137* 100*  ALKPHOS 64 52  BILITOT 0.9 2.3*  PROT 7.4 6.5  ALBUMIN 3.7 2.9*     Lipase     Component Value Date/Time   LIPASE 28 03/01/2014 1335     Studies/Results: No results found.  Medications: . enoxaparin (LOVENOX) injection  40 mg Subcutaneous Q24H  . metoCLOPramide (REGLAN) injection  10 mg Intravenous 4 times per day  . pantoprazole  40 mg Oral BID AC  . piperacillin-tazobactam (ZOSYN)  IV  3.375 g Intravenous Q8H    Assessment/Plan 1. Perforated duodenal bulb ulcer Emergency exploratory laparotomy, primary closure of anterior perforated duodenal bulb ulcer and  Cheree DittoGraham patch placement,03/22/2014, Avel Peaceodd Rosenbower, MD  2. Hepatitis B 3. Tobacco use 4. Thrombocytopenia   Plan:  Saline lock IV, soft diet, recheck labs.  I will place him on Augmentin after current dose of Zosyn.  I am just going to get him an appointment for ID clinic to follow up on his Hepatitis B.  I will discuss going home with Dr. Abbey Chattersosenbower and how much longer he needs antibiotics.  Case management is going to help us work on outpatient care.  Work on sending home today or tomorrow.    LOS: 7 days    Donyell Carrell 03/28/2014

## 2014-03-28 NOTE — Discharge Instructions (Signed)
[facility]      [facility] [phone]  OPEN ABDOMINAL SURGERY: POST OP INSTRUCTIONS  Always review your discharge instruction sheet given to you by the facility where your surgery was performed.  IF YOU HAVE DISABILITY OR FAMILY LEAVE FORMS, YOU MUST BRING THEM TO THE OFFICE FOR PROCESSING.  PLEASE DO NOT GIVE THEM TO YOUR DOCTOR.  1. A prescription for pain medication may be given to you upon discharge.  Take your pain medication as prescribed, if needed.  If narcotic pain medicine is not needed, then you may take acetaminophen (Tylenol) or ibuprofen (Advil) as needed. 2. Take your usually prescribed medications unless otherwise directed. 3. If you need a refill on your pain medication, please contact your pharmacy. They will contact our office to request authorization.  Prescriptions will not be filled after 5pm or on week-ends. 4. You should follow a light diet the first few days after arrival home, such as soup and crackers, pudding, etc.unless your doctor has advised otherwise. A high-fiber, low fat diet can be resumed as tolerated.   Be sure to include lots of fluids daily. Most patients will experience some swelling and bruising on the chest and neck area.  Ice packs will help.  Swelling and bruising can take several days to resolve 5. Most patients will experience some swelling and bruising in the area of the incision. Ice pack will help. Swelling and bruising can take several days to resolve..  6. It is common to experience some constipation if taking pain medication after surgery.  Increasing fluid intake and taking a stool softener will usually help or prevent this problem from occurring.  A mild laxative (Milk of Magnesia or Miralax) should be taken according to package directions if there are no bowel movements after 48 hours. 7.  You may have steri-strips (small skin tapes) in place directly over the incision.  These strips should be left on the skin for 7-10 days.  If your  surgeon used skin glue on the incision, you may shower in 24 hours.  The glue will flake off over the next 2-3 weeks.  Any sutures or staples will be removed at the office during your follow-up visit. You may find that a light gauze bandage over your incision may keep your staples from being rubbed or pulled. You may shower and replace the bandage daily. 8. ACTIVITIES:  You may resume regular (light) daily activities beginning the next day--such as daily self-care, walking, climbing stairs--gradually increasing activities as tolerated.  You may have sexual intercourse when it is comfortable.  Refrain from any heavy lifting or straining until approved by your doctor. a. You may drive when you no longer are taking prescription pain medication, you can comfortably wear a seatbelt, and you can safely maneuver your car and apply brakes b. Return to Work: ___________________________________ 9. You should see your doctor in the office for a follow-up appointment approximately two weeks after your surgery.  Make sure that you call for this appointment within a day or two after you arrive home to insure a convenient appointment time. OTHER INSTRUCTIONS:  _____________________________________________________________ _____________________________________________________________  WHEN TO CALL YOUR DOCTOR: 1. Fever over 101.0 2. Inability to urinate 3. Nausea and/or vomiting 4. Extreme swelling or bruising 5. Continued bleeding from incision. 6. Increased pain, redness, or drainage from the incision. 7. Difficulty swallowing or breathing 8. Muscle cramping or spasms. 9. Numbness or tingling in hands or feet or around lips.  The clinic staff is available to   answer your questions during regular business hours.  Please don't hesitate to call and ask to speak to one of the nurses if you have concerns.  For further questions, please visit [email or web address]   

## 2014-03-28 NOTE — Discharge Summary (Signed)
. Physician Discharge Summary  Patient ID: Peter Hodge MRN: [id number] DOB/AGE: 57/04/1955 57 y.o.  Admit date: 03/21/2014 Discharge date: 03/28/2014  Admission Diagnoses: Perforated Viscus  Discharge Diagnoses:  1. Perforated duodenal bulb ulcer 2. Hepatitis B 3. Tobacco use 4. Thrombocytopenia 5.  H Pylori pending   Principal Problem:   Perforated duodenal bulb ulcer Active Problems:   Hepatitis B   Tobacco use disorder   Thrombocytopenia   PROCEDURES: Emergency exploratory laparotomy, primary closure of anterior perforated duodenal bulb ulcer and Graham patch placement, 03/22/14,  Dr. Avel Peaceodd Hodge.  Hospital Course: 58 year old [name] with the acute onset of lower abdominal and groin pain today. He presented to the emergency department and underwent a CT scan which demonstrated a pneumoperitoneum. Most of the free air is in the right upper quadrant and above the liver. There appears to be an outpouching around the duodenum that is contiguous with some other free air. No leak of contrast. I was asked to see him because of this. He states he occasionally has had some burning epigastric pain. I have communicated with him through an interpreter. He also has a family member who is bilingual. No previous operations. He denies taking any medications. He is a smoker. He was taken to the OR acutely for a perforated viscus with findings showing a perforated duodenal bulb ulcer. Post op he was slow to open up.  He was maintained on IV Zosyn post op for 7 days.  His Wbc returned to normal, he was afebrile and it was discontinued on 03/28/14.  His diet improved as his bowel function returned, and post UGI on 03/25/14.  Study showed no evidence of contrast extravasation or obstruction.  We have obtained H Pylori study, but that will not be performed or available for the next 24-48 hours.  We are arranging Medical  follow up with the [facility] and they will need to follow up on his H  Pylori, ulcer and Hepatitis. Treatment is complicated by the language barrier, and he seems to be unsure of where or when he was treated for Hepatitis B. His H Pylori will not be back for 24-48 hours so we will discharge on the just PPI and pain medicine for now.  I have emphasized he get follow up with Medicine this week.  He cannot tell us anything about treatment for his Hepatitis treatment except he is out of pills.  He doesn't know who or where he has been treated before.  Nothing I found in Epic.  Staples and drain are being removed prior to discharge and Medcine follow up for later this week is anticipated.    Condition on d/c:  Improving  CBC Latest Ref Rng 03/28/2014 03/25/2014 03/23/2014  WBC 4.0 - 10.5 K/uL 3.4(L) 3.3(L) 6.7  Hemoglobin 13.0 - 17.0 g/dL 78.213.1 12.1(L) 13.0  Hematocrit 39.0 - 52.0 % 37.6(L) 35.6(L) 38.9(L)  Platelets 150 - 400 K/uL 105(L) 77(L) 71(L)   CMP Latest Ref Rng 03/28/2014 03/23/2014 03/22/2014  Glucose 70 - 99 mg/dL 956(O141(H) 130(Q103(H) 657(Q125(H)  BUN 6 - 23 mg/dL 5(L) 14 9  Creatinine 4.690.50 - 1.35 mg/dL 6.290.74 5.280.97 4.130.80  Sodium 137 - 147 mEq/L 135(L) 133(L) 136(L)  Potassium 3.7 - 5.3 mEq/L 4.4 4.0 4.1  Chloride 96 - 112 mEq/L 99 98 101  CO2 19 - 32 mEq/L 25 27 23   Calcium 8.4 - 10.5 mg/dL 9.2 9.1 8.8  Total Protein 6.0 - 8.3 g/dL 7.3 6.5 -  Total Bilirubin 0.3 - 1.2  mg/dL 1.2 8.2(N2.3(H) -  Alkaline Phos 39 - 117 U/L 71 52 -  AST 0 - 37 U/L 48(H) 68(H) -  ALT 0 - 53 U/L 64(H) 100(H) -   HEPATITIS A   Hep A IgM           NON RE...     Hep A IgM    HEPATITIS B   Hepatitis B Surface Ag           POSITIVE     Hepatitis B Surface Ag    Hep B C IgM           Reactive     Hep B C IgM    HEPATITIS C   HCV Ab   H Pylori pending!!!  Disposition: 01-Home or Self Care     Medication List    STOP taking these medications        loperamide 2 MG tablet  Commonly known as:  IMODIUM A-D     ondansetron 4 MG tablet  Commonly known as:  ZOFRAN      TAKE these  medications        acetaminophen 325 MG tablet  Commonly known as:  TYLENOL  Take 2 tablets (650 mg total) by mouth every 6 (six) hours as needed.     oxyCODONE-acetaminophen 5-325 MG per tablet  Commonly known as:  ROXICET  Take 1-2 tablets by mouth every 4 (four) hours as needed for severe pain.     pantoprazole 40 MG tablet  Commonly known as:  PROTONIX  Take 1 tablet (40 mg total) by mouth 2 (two) times daily before a meal.       Follow-up Information    Follow up with Peter Hodge On 04/20/2014.   Specialty:  General Surgery   Why:  Your appointment is at 10:00AM be there 40 minutes before that to fill out forms for the computer.  Bring someone to translate for you.Benay Pillow.     Contact information:   [address] [place] [address] [phone]       Follow up with [facility] for follow up of your ulcer and hepatitis. In 1 week.   Why:  Call to follow up your Hepatitis and Ulcer   Contact information:   [address] [place], [place], [address]  [phone]      Signed: Sherrie GeorgeJENNINGS,Unnamed Hodge 03/28/2014, 1:44 PM

## 2014-03-28 NOTE — Care Management Note (Signed)
    Page 1 of 1   03/28/2014     11:51:27 AM CARE MANAGEMENT NOTE 03/28/2014  Patient:  Peter Hodge,Peter Hodge   Account Number:  [id number]  Date Initiated:  03/22/2014  Documentation initiated by:  Lorenda IshiharaPEELE,Any Mcneice  Subjective/Objective Assessment:   58 yo male admitted s/p expl lap with closure of anterior perforated duodenal bulb ulcer. PTA lived at home with family.     Action/Plan:   Home when stable, does not speak English   Anticipated DC Date:  03/28/2014   Anticipated DC Plan:  HOME W HOME HEALTH SERVICES  In-house referral  Interpreting Services      DC Planning Services  CM consult  PCP issues      Choice offered to / List presented to:             Status of service:  Completed, signed off Medicare Important Message given?   (If response is "NO", the following Medicare IM given date fields will be blank) Date Medicare IM given:   Medicare IM given by:   Date Additional Medicare IM given:   Additional Medicare IM given by:    Discharge Disposition:  HOME/SELF CARE  Per UR Regulation:  Reviewed for med. necessity/level of care/duration of stay  If discussed at Long Length of Stay Meetings, dates discussed:    Comments:  03-28-14 Lorenda IshiharaSuzanne Tatjana Turcott RN CM [phone] Spoke with patient at bedside through interpreter. Patient unable to say who will provide f/u care for medical issues. [facility], contacted them to make appt but only got VM. Left a message for them to return my call since patient cannot speak English. Left written information with patient and interpreter. Interpreter will assist in getting patient to appt if one can be made. He agrees to f/u with phone call if unable to secure appt prior to d/c. Patient states he lives with his wife and she will provide care for him at home, states he can afford his medication.

## 2014-03-29 LAB — H. PYLORI ANTIBODY, IGG: H Pylori IgG: 2.94 {ISR} — ABNORMAL HIGH

## 2014-03-30 ENCOUNTER — Encounter (HOSPITAL_COMMUNITY): Payer: Self-pay | Admitting: General Surgery

## 2014-04-13 ENCOUNTER — Encounter: Payer: Self-pay | Admitting: Internal Medicine

## 2014-04-13 ENCOUNTER — Ambulatory Visit: Payer: Self-pay | Attending: Internal Medicine | Admitting: Internal Medicine

## 2014-04-13 VITALS — BP 153/90 | HR 84 | Temp 98.0°F | Resp 16 | Wt 135.6 lb

## 2014-04-13 DIAGNOSIS — Z1211 Encounter for screening for malignant neoplasm of colon: Secondary | ICD-10-CM

## 2014-04-13 DIAGNOSIS — IMO0001 Reserved for inherently not codable concepts without codable children: Secondary | ICD-10-CM | POA: Insufficient documentation

## 2014-04-13 DIAGNOSIS — R030 Elevated blood-pressure reading, without diagnosis of hypertension: Secondary | ICD-10-CM | POA: Insufficient documentation

## 2014-04-13 DIAGNOSIS — R768 Other specified abnormal immunological findings in serum: Secondary | ICD-10-CM

## 2014-04-13 DIAGNOSIS — Z139 Encounter for screening, unspecified: Secondary | ICD-10-CM

## 2014-04-13 DIAGNOSIS — K265 Chronic or unspecified duodenal ulcer with perforation: Secondary | ICD-10-CM | POA: Insufficient documentation

## 2014-04-13 DIAGNOSIS — R760 Raised antibody titer: Secondary | ICD-10-CM

## 2014-04-13 DIAGNOSIS — F17210 Nicotine dependence, cigarettes, uncomplicated: Secondary | ICD-10-CM | POA: Insufficient documentation

## 2014-04-13 DIAGNOSIS — B9681 Helicobacter pylori [H. pylori] as the cause of diseases classified elsewhere: Secondary | ICD-10-CM | POA: Insufficient documentation

## 2014-04-13 DIAGNOSIS — Z720 Tobacco use: Secondary | ICD-10-CM

## 2014-04-13 DIAGNOSIS — F17200 Nicotine dependence, unspecified, uncomplicated: Secondary | ICD-10-CM | POA: Insufficient documentation

## 2014-04-13 DIAGNOSIS — R7689 Other specified abnormal immunological findings in serum: Secondary | ICD-10-CM | POA: Insufficient documentation

## 2014-04-13 DIAGNOSIS — Z833 Family history of diabetes mellitus: Secondary | ICD-10-CM | POA: Insufficient documentation

## 2014-04-13 DIAGNOSIS — Z79899 Other long term (current) drug therapy: Secondary | ICD-10-CM | POA: Insufficient documentation

## 2014-04-13 DIAGNOSIS — B1910 Unspecified viral hepatitis B without hepatic coma: Secondary | ICD-10-CM | POA: Insufficient documentation

## 2014-04-13 DIAGNOSIS — B9689 Other specified bacterial agents as the cause of diseases classified elsewhere: Secondary | ICD-10-CM | POA: Insufficient documentation

## 2014-04-13 DIAGNOSIS — B169 Acute hepatitis B without delta-agent and without hepatic coma: Secondary | ICD-10-CM

## 2014-04-13 LAB — CBC WITH DIFFERENTIAL/PLATELET
Basophils Absolute: 0 10*3/uL (ref 0.0–0.1)
Basophils Relative: 0 % (ref 0–1)
EOS PCT: 3 % (ref 0–5)
Eosinophils Absolute: 0.2 10*3/uL (ref 0.0–0.7)
HCT: 39.2 % (ref 39.0–52.0)
Hemoglobin: 13.8 g/dL (ref 13.0–17.0)
LYMPHS PCT: 29 % (ref 12–46)
Lymphs Abs: 1.7 10*3/uL (ref 0.7–4.0)
MCH: 30.7 pg (ref 26.0–34.0)
MCHC: 35.2 g/dL (ref 30.0–36.0)
MCV: 87.1 fL (ref 78.0–100.0)
MONO ABS: 0.4 10*3/uL (ref 0.1–1.0)
MPV: 10.2 fL (ref 8.6–12.4)
Monocytes Relative: 7 % (ref 3–12)
Neutro Abs: 3.5 10*3/uL (ref 1.7–7.7)
Neutrophils Relative %: 61 % (ref 43–77)
PLATELETS: 136 10*3/uL — AB (ref 150–400)
RBC: 4.5 MIL/uL (ref 4.22–5.81)
RDW: 15.4 % (ref 11.5–15.5)
WBC: 5.7 10*3/uL (ref 4.0–10.5)

## 2014-04-13 LAB — COMPLETE METABOLIC PANEL WITH GFR
ALBUMIN: 4 g/dL (ref 3.5–5.2)
ALT: 85 U/L — ABNORMAL HIGH (ref 0–53)
AST: 54 U/L — AB (ref 0–37)
Alkaline Phosphatase: 69 U/L (ref 39–117)
BUN: 11 mg/dL (ref 6–23)
CALCIUM: 9.3 mg/dL (ref 8.4–10.5)
CO2: 26 meq/L (ref 19–32)
Chloride: 101 mEq/L (ref 96–112)
Creat: 0.82 mg/dL (ref 0.50–1.35)
GFR, Est African American: >89 mL/min
Glucose, Bld: 85 mg/dL (ref 70–99)
POTASSIUM: 3.9 meq/L (ref 3.5–5.3)
SODIUM: 137 meq/L (ref 135–145)
TOTAL PROTEIN: 7.6 g/dL (ref 6.0–8.3)
Total Bilirubin: 0.5 mg/dL (ref 0.2–1.2)

## 2014-04-13 MED ORDER — AMOXICILLIN 500 MG PO TABS: 1000.0000 mg | ORAL_TABLET | Freq: Two times a day (BID) | ORAL | Status: DC

## 2014-04-13 MED ORDER — CLARITHROMYCIN 500 MG PO TABS: 500.0000 mg | ORAL_TABLET | Freq: Two times a day (BID) | ORAL | Status: DC

## 2014-04-13 NOTE — Progress Notes (Signed)
Patient Demographics  Peter Hodge, is a 58 y.o. male  AVW:[id number]  WGN:[id number]  DOB - 11/16/1955  CC:  Chief Complaint  Patient presents with  . Establish Care       HPI: Peter Hodge is a 58 y.o. male here today to establish medical care.Patient was recently hospitalized with a lower abdominal pain, EMR reviewed patient had a CT scan done which demonstrated pneumoperitoneum, patient underwent emergency exploratory laparotomy found to have perforated duodenal ulcer which was surgically repaired, he was kept on IV antibiotic postoperative patient didn't had H. pylori antibody test done which noticed to have come back positive patient was discharged on PPIs but not on triple regimen, patient also noticed to have hepatitis B,? If he has been treated in the past, patient was advised to follow with surgery and has scheduled appointment next month patient does smoke cigarettes, advised patient to quit smoking. Today's blood pressure is borderline elevated, denies any headache dizziness chest and shortness of breath. Patient has No headache, No chest pain, No abdominal pain - No Nausea, No new weakness tingling or numbness, No Cough - SOB.  Allergies  Allergen Reactions  . Beef-Derived Products Other (See Comments)   Past Medical History  Diagnosis Date  . Hepatitis B   . Hepatitis B 03/28/2014  . Tobacco use disorder 03/28/2014  . Thrombocytopenia 03/28/2014   Current Outpatient Prescriptions on File Prior to Visit  Medication Sig Dispense Refill  . oxyCODONE-acetaminophen (ROXICET) 5-325 MG per tablet Take 1-2 tablets by mouth every 4 (four) hours as needed for severe pain. 40 tablet 0  . pantoprazole (PROTONIX) 40 MG tablet Take 1 tablet (40 mg total) by mouth 2 (two) times daily before a meal. 60 tablet 1  . acetaminophen (TYLENOL) 325 MG tablet Take 2 tablets (650 mg total) by mouth every 6 (six) hours as needed.     No current facility-administered medications on file  prior to visit.   Family History  Problem Relation Age of Onset  . Diabetes Son    History   Social History  . Marital Status: Married    Spouse Name: N/A    Number of Children: N/A  . Years of Education: N/A   Occupational History  . Not on file.   Social History Main Topics  . Smoking status: Current Every Day Smoker -- 0.25 packs/day  . Smokeless tobacco: Not on file  . Alcohol Use: No  . Drug Use: No  . Sexual Activity: Not on file   Other Topics Concern  . Not on file   Social History Narrative    Review of Systems: Constitutional: Negative for fever, chills, diaphoresis, activity change, appetite change and fatigue. HENT: Negative for ear pain, nosebleeds, congestion, facial swelling, rhinorrhea, neck pain, neck stiffness and ear discharge.  Eyes: Negative for pain, discharge, redness, itching and visual disturbance. Respiratory: Negative for cough, choking, chest tightness, shortness of breath, wheezing and stridor.  Cardiovascular: Negative for chest pain, palpitations and leg swelling. Gastrointestinal: Negative for abdominal distention. Genitourinary: Negative for dysuria, urgency, frequency, hematuria, flank pain, decreased urine volume, difficulty urinating and dyspareunia.  Musculoskeletal: Negative for back pain, joint swelling, arthralgia and gait problem. Neurological: Negative for dizziness, tremors, seizures, syncope, facial asymmetry, speech difficulty, weakness, light-headedness, numbness and headaches.  Hematological: Negative for adenopathy. Does not bruise/bleed easily. Psychiatric/Behavioral: Negative for hallucinations, behavioral problems, confusion, dysphoric mood, decreased concentration and agitation.    Objective:   Filed Vitals:   04/13/14 1632  BP:  153/90  Pulse: 84  Temp: 98 F (36.7 C)  Resp: 16    Physical Exam: Constitutional: Patient appears well-developed and well-nourished. No distress. HENT: Normocephalic, atraumatic,  External right and left ear normal. Oropharynx is clear and moist.  Eyes: Conjunctivae and EOM are normal. PERRLA, no scleral icterus. Neck: Normal ROM. Neck supple. No JVD. No tracheal deviation. No thyromegaly. CVS: RRR, S1/S2 +, no murmurs, no gallops, no carotid bruit.  Pulmonary: Effort and breath sounds normal, no stridor, rhonchi, wheezes, rales.  Abdominal: Soft. BS +, no distension, tenderness, rebound or guarding. Surgical site clean   Musculoskeletal: Normal range of motion. No edema and no tenderness.  Neuro: Alert. Normal reflexes, muscle tone coordination. No cranial nerve deficit. Skin: Skin is warm and dry. No rash noted. Not diaphoretic. No erythema. No pallor. Psychiatric: Normal mood and affect. Behavior, judgment, thought content normal.  Lab Results  Component Value Date   WBC 3.4* 03/28/2014   HGB 13.1 03/28/2014   HCT 37.6* 03/28/2014   MCV 89.3 03/28/2014   PLT 105* 03/28/2014   Lab Results  Component Value Date   CREATININE 0.74 03/28/2014   BUN 5* 03/28/2014   NA 135* 03/28/2014   K 4.4 03/28/2014   CL 99 03/28/2014   CO2 25 03/28/2014    No results found for: HGBA1C Lipid Panel  No results found for: CHOL, TRIG, HDL, CHOLHDL, VLDL, LDLCALC     Assessment and plan:   1. Perforated duodenal bulb ulcer Status post laparotomy and surgical repair, scheduled to follow with general surgery next month.  2. Hepatitis B infection without delta agent without hepatic coma, unspecified chronicity  - Ambulatory referral to Infectious Disease  3. Helicobacter pylori ab+ Patient already has prescription for Protonix, I have prescribed amoxicillin and clarithromycin to continue for 2 weeks - amoxicillin (AMOXIL) 500 MG tablet; Take 2 tablets (1,000 mg total) by mouth 2 (two) times daily.  Dispense: 56 tablet; Refill: 0 - clarithromycin (BIAXIN) 500 MG tablet; Take 1 tablet (500 mg total) by mouth 2 (two) times daily.  Dispense: 28 tablet; Refill: 0 -  Ambulatory referral to Gastroenterology  4. Elevated BP Advised patient for DASH diet. Will reevaluate on the next visit.  5. Smoking Advised patient to quit smoking.  6. Special screening for malignant neoplasms, colon  - Ambulatory referral to Gastroenterology  7. Family history of diabetes mellitus (DM)  - Hemoglobin A1c  8. Screening Ordered baseline blood work. - COMPLETE METABOLIC PANEL WITH GFR - CBC with Differential - TSH - Vit D  25 hydroxy (rtn osteoporosis monitoring)      Health Maintenance -Colonoscopy: referred to GI  -Vaccinations:  As per patient he had flu shot this year   Return in about 3 months (around 07/13/2014).    Doris CheadleADVANI, Brentlee Delage, MD

## 2014-04-13 NOTE — Patient Instructions (Signed)
Smoking Cessation Quitting smoking is important to your health and has many advantages. However, it is not always easy to quit since nicotine is a very addictive drug. Oftentimes, people try 3 times or more before being able to quit. This document explains the best ways for you to prepare to quit smoking. Quitting takes hard work and a lot of effort, but you can do it. ADVANTAGES OF QUITTING SMOKING  You will live longer, feel better, and live better.  Your body will feel the impact of quitting smoking almost immediately.  Within 20 minutes, blood pressure decreases. Your pulse returns to its normal level.  After 8 hours, carbon monoxide levels in the blood return to normal. Your oxygen level increases.  After 24 hours, the chance of having a heart attack starts to decrease. Your breath, hair, and body stop smelling like smoke.  After 48 hours, damaged nerve endings begin to recover. Your sense of taste and smell improve.  After 72 hours, the body is virtually free of nicotine. Your bronchial tubes relax and breathing becomes easier.  After 2 to 12 weeks, lungs can hold more air. Exercise becomes easier and circulation improves.  The risk of having a heart attack, stroke, cancer, or lung disease is greatly reduced.  After 1 year, the risk of coronary heart disease is cut in half.  After 5 years, the risk of stroke falls to the same as a nonsmoker.  After 10 years, the risk of lung cancer is cut in half and the risk of other cancers decreases significantly.  After 15 years, the risk of coronary heart disease drops, usually to the level of a nonsmoker.  If you are pregnant, quitting smoking will improve your chances of having a healthy baby.  The people you live with, especially any children, will be healthier.  You will have extra money to spend on things other than cigarettes. QUESTIONS TO THINK ABOUT BEFORE ATTEMPTING TO QUIT You may want to talk about your answers with your  health care provider.  Why do you want to quit?  If you tried to quit in the past, what helped and what did not?  What will be the most difficult situations for you after you quit? How will you plan to handle them?  Who can help you through the tough times? Your family? Friends? A health care provider?  What pleasures do you get from smoking? What ways can you still get pleasure if you quit? Here are some questions to ask your health care provider:  How can you help me to be successful at quitting?  What medicine do you think would be best for me and how should I take it?  What should I do if I need more help?  What is smoking withdrawal like? How can I get information on withdrawal? GET READY  Set a quit date.  Change your environment by getting rid of all cigarettes, ashtrays, matches, and lighters in your home, car, or work. Do not let people smoke in your home.  Review your past attempts to quit. Think about what worked and what did not. GET SUPPORT AND ENCOURAGEMENT You have a better chance of being successful if you have help. You can get support in many ways.  Tell your family, friends, and coworkers that you are going to quit and need their support. Ask them not to smoke around you.  Get individual, group, or telephone counseling and support. Programs are available at local hospitals and health centers. Call   your local health department for information about programs in your area.  Spiritual beliefs and practices may help some smokers quit.  Download a "quit meter" on your computer to keep track of quit statistics, such as how long you have gone without smoking, cigarettes not smoked, and money saved.  Get a self-help book about quitting smoking and staying off tobacco. LEARN NEW SKILLS AND BEHAVIORS  Distract yourself from urges to smoke. Talk to someone, go for a walk, or occupy your time with a task.  Change your normal routine. Take a different route to work.  Drink tea instead of coffee. Eat breakfast in a different place.  Reduce your stress. Take a hot bath, exercise, or read a book.  Plan something enjoyable to do every day. Reward yourself for not smoking.  Explore interactive web-based programs that specialize in helping you quit. GET MEDICINE AND USE IT CORRECTLY Medicines can help you stop smoking and decrease the urge to smoke. Combining medicine with the above behavioral methods and support can greatly increase your chances of successfully quitting smoking.  Nicotine replacement therapy helps deliver nicotine to your body without the negative effects and risks of smoking. Nicotine replacement therapy includes nicotine gum, lozenges, inhalers, nasal sprays, and skin patches. Some may be available over-the-counter and others require a prescription.  Antidepressant medicine helps people abstain from smoking, but how this works is unknown. This medicine is available by prescription.  Nicotinic receptor partial agonist medicine simulates the effect of nicotine in your brain. This medicine is available by prescription. Ask your health care provider for advice about which medicines to use and how to use them based on your health history. Your health care provider will tell you what side effects to look out for if you choose to be on a medicine or therapy. Carefully read the information on the package. Do not use any other product containing nicotine while using a nicotine replacement product.  RELAPSE OR DIFFICULT SITUATIONS Most relapses occur within the first 3 months after quitting. Do not be discouraged if you start smoking again. Remember, most people try several times before finally quitting. You may have symptoms of withdrawal because your body is used to nicotine. You may crave cigarettes, be irritable, feel very hungry, cough often, get headaches, or have difficulty concentrating. The withdrawal symptoms are only temporary. They are strongest  when you first quit, but they will go away within 10-14 days. To reduce the chances of relapse, try to:  Avoid drinking alcohol. Drinking lowers your chances of successfully quitting.  Reduce the amount of caffeine you consume. Once you quit smoking, the amount of caffeine in your body increases and can give you symptoms, such as a rapid heartbeat, sweating, and anxiety.  Avoid smokers because they can make you want to smoke.  Do not let weight gain distract you. Many smokers will gain weight when they quit, usually less than 10 pounds. Eat a healthy diet and stay active. You can always lose the weight gained after you quit.  Find ways to improve your mood other than smoking. FOR MORE INFORMATION  www.smokefree.gov  Document Released: 03/26/2001 Document Revised: 08/16/2013 Document Reviewed: 07/11/2011 ExitCare Patient Information 2015 [employer]. This information is not intended to replace advice given to you by your health care provider. Make sure you discuss any questions you have with your health care provider. DASH Eating Plan DASH stands for "Dietary Approaches to Stop Hypertension." The DASH eating plan is a healthy eating plan that has   been shown to reduce high blood pressure (hypertension). Additional health benefits may include reducing the risk of type 2 diabetes mellitus, heart disease, and stroke. The DASH eating plan may also help with weight loss. WHAT DO I NEED TO KNOW ABOUT THE DASH EATING PLAN? For the DASH eating plan, you will follow these general guidelines:  Choose foods with a percent daily value for sodium of less than 5% (as listed on the food label).  Use salt-free seasonings or herbs instead of table salt or sea salt.  Check with your health care provider or pharmacist before using salt substitutes.  Eat lower-sodium products, often labeled as "lower sodium" or "no salt added."  Eat fresh foods.  Eat more vegetables, fruits, and low-fat dairy  products.  Choose whole grains. Look for the word "whole" as the first word in the ingredient list.  Choose fish and skinless chicken or turkey more often than red meat. Limit fish, poultry, and meat to 6 oz (170 g) each day.  Limit sweets, desserts, sugars, and sugary drinks.  Choose heart-healthy fats.  Limit cheese to 1 oz (28 g) per day.  Eat more home-cooked food and less restaurant, buffet, and fast food.  Limit fried foods.  Cook foods using methods other than frying.  Limit canned vegetables. If you do use them, rinse them well to decrease the sodium.  When eating at a restaurant, ask that your food be prepared with less salt, or no salt if possible. WHAT FOODS CAN I EAT? Seek help from a dietitian for individual calorie needs. Grains Whole grain or whole wheat bread. Brown rice. Whole grain or whole wheat pasta. Quinoa, bulgur, and whole grain cereals. Low-sodium cereals. Corn or whole wheat flour tortillas. Whole grain cornbread. Whole grain crackers. Low-sodium crackers. Vegetables Fresh or frozen vegetables (raw, steamed, roasted, or grilled). Low-sodium or reduced-sodium tomato and vegetable juices. Low-sodium or reduced-sodium tomato sauce and paste. Low-sodium or reduced-sodium canned vegetables.  Fruits All fresh, canned (in natural juice), or frozen fruits. Meat and Other Protein Products Ground beef (85% or leaner), grass-fed beef, or beef trimmed of fat. Skinless chicken or turkey. Ground chicken or turkey. Pork trimmed of fat. All fish and seafood. Eggs. Dried beans, peas, or lentils. Unsalted nuts and seeds. Unsalted canned beans. Dairy Low-fat dairy products, such as skim or 1% milk, 2% or reduced-fat cheeses, low-fat ricotta or cottage cheese, or plain low-fat yogurt. Low-sodium or reduced-sodium cheeses. Fats and Oils Tub margarines without trans fats. Light or reduced-fat mayonnaise and salad dressings (reduced sodium). Avocado. Safflower, olive, or canola  oils. Natural peanut or almond butter. Other Unsalted popcorn and pretzels. The items listed above may not be a complete list of recommended foods or beverages. Contact your dietitian for more options. WHAT FOODS ARE NOT RECOMMENDED? Grains White bread. White pasta. White rice. Refined cornbread. Bagels and croissants. Crackers that contain trans fat. Vegetables Creamed or fried vegetables. Vegetables in a cheese sauce. Regular canned vegetables. Regular canned tomato sauce and paste. Regular tomato and vegetable juices. Fruits Dried fruits. Canned fruit in light or heavy syrup. Fruit juice. Meat and Other Protein Products Fatty cuts of meat. Ribs, chicken wings, bacon, sausage, bologna, salami, chitterlings, fatback, hot dogs, bratwurst, and packaged luncheon meats. Salted nuts and seeds. Canned beans with salt. Dairy Whole or 2% milk, cream, half-and-half, and cream cheese. Whole-fat or sweetened yogurt. Full-fat cheeses or blue cheese. Nondairy creamers and whipped toppings. Processed cheese, cheese spreads, or cheese curds. Condiments Onion and garlic salt,   seasoned salt, table salt, and sea salt. Canned and packaged gravies. Worcestershire sauce. Tartar sauce. Barbecue sauce. Teriyaki sauce. Soy sauce, including reduced sodium. Steak sauce. Fish sauce. Oyster sauce. Cocktail sauce. Horseradish. Ketchup and mustard. Meat flavorings and tenderizers. Bouillon cubes. Hot sauce. Tabasco sauce. Marinades. Taco seasonings. Relishes. Fats and Oils Butter, stick margarine, lard, shortening, ghee, and bacon fat. Coconut, palm kernel, or palm oils. Regular salad dressings. Other Pickles and olives. Salted popcorn and pretzels. The items listed above may not be a complete list of foods and beverages to avoid. Contact your dietitian for more information. WHERE CAN I FIND MORE INFORMATION? National Heart, Lung, and Blood Institute: www.nhlbi.nih.gov/health/health-topics/topics/dash/ Document Released:  03/21/2011 Document Revised: 08/16/2013 Document Reviewed: 02/03/2013 ExitCare Patient Information 2015 ExitCare[employer]. This information is not intended to replace advice given to you by your health care provider. Make sure you discuss any questions you have with your health care provider.  

## 2014-04-13 NOTE — Progress Notes (Signed)
Patient here with interpreter Patient is here to establish care Per interpreter was [facility]

## 2014-04-14 LAB — HEMOGLOBIN A1C
HEMOGLOBIN A1C: 5.3 % (ref <5.7)
Mean Plasma Glucose: 105 mg/dL (ref <117)

## 2014-04-14 LAB — VITAMIN D 25 HYDROXY (VIT D DEFICIENCY, FRACTURES): Vit D, 25-Hydroxy: 7 ng/mL — ABNORMAL LOW (ref 30–100)

## 2014-04-14 LAB — TSH: TSH: 0.746 u[IU]/mL (ref 0.350–4.500)

## 2014-04-19 ENCOUNTER — Telehealth (INDEPENDENT_AMBULATORY_CARE_PROVIDER_SITE_OTHER): Payer: Self-pay

## 2014-04-19 NOTE — Telephone Encounter (Signed)
-----   Message from Sherrie GeorgeWillard Jennings, [place] sent at 03/29/2014  3:33 PM EST ----- Ok they came back to the hospital because they could not afford the medicine.  [facility], and for meds from here.  Can you put this in the office note so they know when and if he comes back.  Thanks wj ----- Message -----    From: Louie Bunonya Adeena Bernabe, RN    Sent: 03/29/2014   2:40 PM      To: Sherrie GeorgeWillard Jennings, PA-C  Tried calling this pt no response Thanks  Archie Pattenonya  ----- Message -----    From: Sherrie GeorgeWillard Jennings, PA-C    Sent: 03/29/2014  10:09 AM      To: [facility] Clinical Pool  He needs 14 days of amoxicillin 1 gm, BID Clarithromycin 500 mg Bid 14 days.  Burmese is the primary  language so someone may have to call you back. He is already on PPI bid.  Sorry this just came up.   wj

## 2014-05-03 ENCOUNTER — Telehealth: Payer: Self-pay | Admitting: *Deleted

## 2014-05-03 MED ORDER — VITAMIN D (ERGOCALCIFEROL) 1.25 MG (50000 UNIT) PO CAPS: 50000.0000 [IU] | ORAL_CAPSULE | ORAL | Status: AC

## 2014-05-03 NOTE — Telephone Encounter (Signed)
[employer] interpreted Clydie BraunKaren # [id number] Left voice message to return call. Medication in the pharmacy

## 2014-05-03 NOTE — Telephone Encounter (Signed)
-----   Message from Doris Cheadleeepak Advani, MD sent at 04/14/2014  9:24 AM EST ----- Blood work reviewed, noticed low vitamin D, call patient advise to start ergocalciferol 50,000 units once a week for the duration of  12 weeks. Patient has abnormal LFTs  Likely secondary to hep B, already referred to infectious disease. Also noticed platelet count is improving.

## 2014-05-09 ENCOUNTER — Ambulatory Visit: Payer: Self-pay | Admitting: Infectious Diseases

## 2014-05-11 ENCOUNTER — Ambulatory Visit: Payer: Self-pay | Admitting: Infectious Diseases

## 2014-05-11 ENCOUNTER — Ambulatory Visit: Payer: Self-pay | Attending: Internal Medicine

## 2014-05-11 ENCOUNTER — Ambulatory Visit (INDEPENDENT_AMBULATORY_CARE_PROVIDER_SITE_OTHER): Payer: Self-pay | Admitting: Infectious Diseases

## 2014-05-11 ENCOUNTER — Encounter: Payer: Self-pay | Admitting: Infectious Diseases

## 2014-05-11 VITALS — BP 147/81 | HR 81 | Temp 98.9°F | Wt 143.0 lb

## 2014-05-11 DIAGNOSIS — R760 Raised antibody titer: Secondary | ICD-10-CM

## 2014-05-11 DIAGNOSIS — B181 Chronic viral hepatitis B without delta-agent: Secondary | ICD-10-CM

## 2014-05-11 DIAGNOSIS — R768 Other specified abnormal immunological findings in serum: Secondary | ICD-10-CM

## 2014-05-11 LAB — CBC
HEMATOCRIT: 43 % (ref 39.0–52.0)
Hemoglobin: 14.8 g/dL (ref 13.0–17.0)
MCH: 31 pg (ref 26.0–34.0)
MCHC: 34.4 g/dL (ref 30.0–36.0)
MCV: 90 fL (ref 78.0–100.0)
MPV: 10.3 fL (ref 8.6–12.4)
PLATELETS: 109 10*3/uL — AB (ref 150–400)
RBC: 4.78 MIL/uL (ref 4.22–5.81)
RDW: 14.7 % (ref 11.5–15.5)
WBC: 6.4 10*3/uL (ref 4.0–10.5)

## 2014-05-11 NOTE — Progress Notes (Signed)
   Subjective:    Patient ID: Peter Hodge, male    DOB: 05/13/1955, 59 y.o.   MRN: [id number]  HPI 59 yo M [place]. Was adm to hospital in December 2015 with perforated duodenal ulcer and underwent repair. He was also found to be h pylori+. He states that he was on anbx for 1 month after surgery. He also has a hx of Hep B Ab+. He was dx with Hep B 1 year after arriving in [place].  Not currently on medications.  Has last LFTs showed mild elevation. CT scan 03-21-14 did not show cirrhosis.    Sochx/fhx reviewed.   Review of Systems  Constitutional: Negative for appetite change and unexpected weight change.  Gastrointestinal: Negative for diarrhea and constipation.  Genitourinary: Negative for hematuria and difficulty urinating.  Hematological: Does not bruise/bleed easily.      Objective:   Physical Exam  Constitutional: He appears well-developed and well-nourished.  HENT:  Mouth/Throat: No oropharyngeal exudate.  Eyes: EOM are normal. Pupils are equal, round, and reactive to light. Right conjunctiva is injected. Left conjunctiva is injected. No scleral icterus.  Neck: Neck supple.  Cardiovascular: Normal rate, regular rhythm and normal heart sounds.   Pulmonary/Chest: Effort normal and breath sounds normal.  Abdominal: Soft. Bowel sounds are normal. He exhibits no distension. There is no tenderness.  Musculoskeletal: He exhibits no edema.  Lymphadenopathy:    He has no cervical adenopathy.          Assessment & Plan:

## 2014-05-11 NOTE — Assessment & Plan Note (Signed)
By his history, he was treated for this.

## 2014-05-11 NOTE — Assessment & Plan Note (Signed)
He has positive antibodies. Will complete his hepatits panel, check DNA, check HIV.  Will not repeat his hepatic imaging as he had CT.  See him back in 2-3 weeks.

## 2014-05-12 LAB — HEPATITIS PANEL, ACUTE
HCV Ab: NEGATIVE
HEP B S AG: POSITIVE — AB
Hep A IgM: NONREACTIVE
Hep B C IgM: REACTIVE — AB

## 2014-05-12 LAB — COMPREHENSIVE METABOLIC PANEL
ALBUMIN: 4 g/dL (ref 3.5–5.2)
ALT: 55 U/L — ABNORMAL HIGH (ref 0–53)
AST: 43 U/L — AB (ref 0–37)
Alkaline Phosphatase: 71 U/L (ref 39–117)
BUN: 11 mg/dL (ref 6–23)
CHLORIDE: 99 meq/L (ref 96–112)
CO2: 29 mEq/L (ref 19–32)
Calcium: 9.5 mg/dL (ref 8.4–10.5)
Creat: 0.74 mg/dL (ref 0.50–1.35)
Glucose, Bld: 79 mg/dL (ref 70–99)
POTASSIUM: 3.7 meq/L (ref 3.5–5.3)
Sodium: 135 mEq/L (ref 135–145)
TOTAL PROTEIN: 7.6 g/dL (ref 6.0–8.3)
Total Bilirubin: 0.8 mg/dL (ref 0.2–1.2)

## 2014-05-12 LAB — PROTIME-INR
INR: 1.03 (ref <1.50)
Prothrombin Time: 13.5 seconds (ref 11.6–15.2)

## 2014-05-12 LAB — HIV ANTIBODY (ROUTINE TESTING W REFLEX): HIV: NONREACTIVE

## 2014-05-12 LAB — HEPATITIS B SURF AG CONFIRMATION: HEPATITIS B SURFACE ANTIGEN CONFIRMATION: POSITIVE — AB

## 2014-05-13 LAB — HEPATITIS B E ANTIGEN: HEPATITIS BE ANTIGEN: NONREACTIVE

## 2014-05-16 LAB — HEPATITIS B DNA, ULTRAQUANTITATIVE, PCR
HEPATITIS B DNA (CALC): 615581 {copies}/mL — AB (ref <116)
HEPATITIS B DNA: 105770 [IU]/mL — AB (ref <20)

## 2014-05-25 ENCOUNTER — Ambulatory Visit: Payer: Self-pay | Admitting: Infectious Diseases

## 2019-05-19 ENCOUNTER — Ambulatory Visit: Payer: MEDICAID | Attending: Internal Medicine

## 2019-05-19 DIAGNOSIS — Z20822 Contact with and (suspected) exposure to covid-19: Secondary | ICD-10-CM

## 2019-05-20 ENCOUNTER — Other Ambulatory Visit: Payer: Self-pay

## 2019-05-20 LAB — NOVEL CORONAVIRUS, NAA: SARS-CoV-2, NAA: NOT DETECTED

## 2020-08-20 ENCOUNTER — Emergency Department (HOSPITAL_COMMUNITY): Payer: BC Managed Care – PPO

## 2020-08-20 ENCOUNTER — Encounter (HOSPITAL_COMMUNITY): Payer: Self-pay

## 2020-08-20 ENCOUNTER — Emergency Department (HOSPITAL_COMMUNITY)
Admission: EM | Admit: 2020-08-20 | Discharge: 2020-08-20 | Disposition: A | Discharge status: Home / Self Care | Payer: BC Managed Care – PPO | Attending: Emergency Medicine | Admitting: Emergency Medicine

## 2020-08-20 ENCOUNTER — Other Ambulatory Visit: Payer: Self-pay

## 2020-08-20 DIAGNOSIS — F17200 Nicotine dependence, unspecified, uncomplicated: Secondary | ICD-10-CM | POA: Insufficient documentation

## 2020-08-20 DIAGNOSIS — M19011 Primary osteoarthritis, right shoulder: Secondary | ICD-10-CM | POA: Diagnosis not present

## 2020-08-20 DIAGNOSIS — Y92410 Unspecified street and highway as the place of occurrence of the external cause: Secondary | ICD-10-CM | POA: Insufficient documentation

## 2020-08-20 DIAGNOSIS — M25511 Pain in right shoulder: Secondary | ICD-10-CM | POA: Diagnosis present

## 2020-08-20 MED ORDER — DICLOFENAC SODIUM 1 % EX GEL: 2.0000 g | Freq: Four times a day (QID) | CUTANEOUS | 0 refills | Status: AC
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
## 2020-08-20 NOTE — Discharge Instructions (Signed)
Apply Voltaren gel to your shoulder as needed as directed for pain.  Follow-up with your doctor for recheck. Your xray today shows arthritis.
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
